# Patient Record
Sex: Male | Born: 1955
Health system: Southern US, Community
[De-identification: ages and names within clinical notes are randomized; demographics above are authoritative.]

---

## 2007-12-15 ENCOUNTER — Ambulatory Visit (HOSPITAL_COMMUNITY): Admission: RE | Admit: 2007-12-15 | Discharge: 2007-12-15 | Payer: Self-pay | Admitting: Gastroenterology

## 2013-09-05 ENCOUNTER — Other Ambulatory Visit: Payer: Self-pay | Admitting: Gastroenterology

## 2013-09-05 DIAGNOSIS — R109 Unspecified abdominal pain: Secondary | ICD-10-CM

## 2013-09-13 ENCOUNTER — Ambulatory Visit
Admission: RE | Admit: 2013-09-13 | Discharge: 2013-09-13 | Disposition: A | Discharge status: Home / Self Care | Payer: 59 | Source: Ambulatory Visit | Attending: Gastroenterology | Admitting: Gastroenterology

## 2013-09-13 DIAGNOSIS — R109 Unspecified abdominal pain: Secondary | ICD-10-CM

## 2014-05-25 ENCOUNTER — Other Ambulatory Visit: Payer: Self-pay | Admitting: Internal Medicine

## 2014-05-25 ENCOUNTER — Ambulatory Visit
Admission: RE | Admit: 2014-05-25 | Discharge: 2014-05-25 | Disposition: A | Discharge status: Home / Self Care | Payer: 59 | Source: Ambulatory Visit | Attending: Internal Medicine | Admitting: Internal Medicine

## 2014-05-25 DIAGNOSIS — M79609 Pain in unspecified limb: Secondary | ICD-10-CM

## 2014-08-27 ENCOUNTER — Other Ambulatory Visit: Payer: Self-pay | Admitting: Internal Medicine

## 2014-08-27 DIAGNOSIS — K824 Cholesterolosis of gallbladder: Secondary | ICD-10-CM

## 2014-09-14 ENCOUNTER — Ambulatory Visit
Admission: RE | Admit: 2014-09-14 | Discharge: 2014-09-14 | Disposition: A | Discharge status: Home / Self Care | Payer: 59 | Source: Ambulatory Visit | Attending: Internal Medicine | Admitting: Internal Medicine

## 2014-09-14 DIAGNOSIS — K824 Cholesterolosis of gallbladder: Secondary | ICD-10-CM

## 2014-09-15 IMAGING — CR DG FOOT COMPLETE 3+V*L*
3 series · 3 of 3 positions shown · non-contrast
Comparison: None.

CLINICAL DATA: Injury.  Left foot pain.

EXAM:
LEFT FOOT - COMPLETE 3+ VIEW

[t foot ap left]
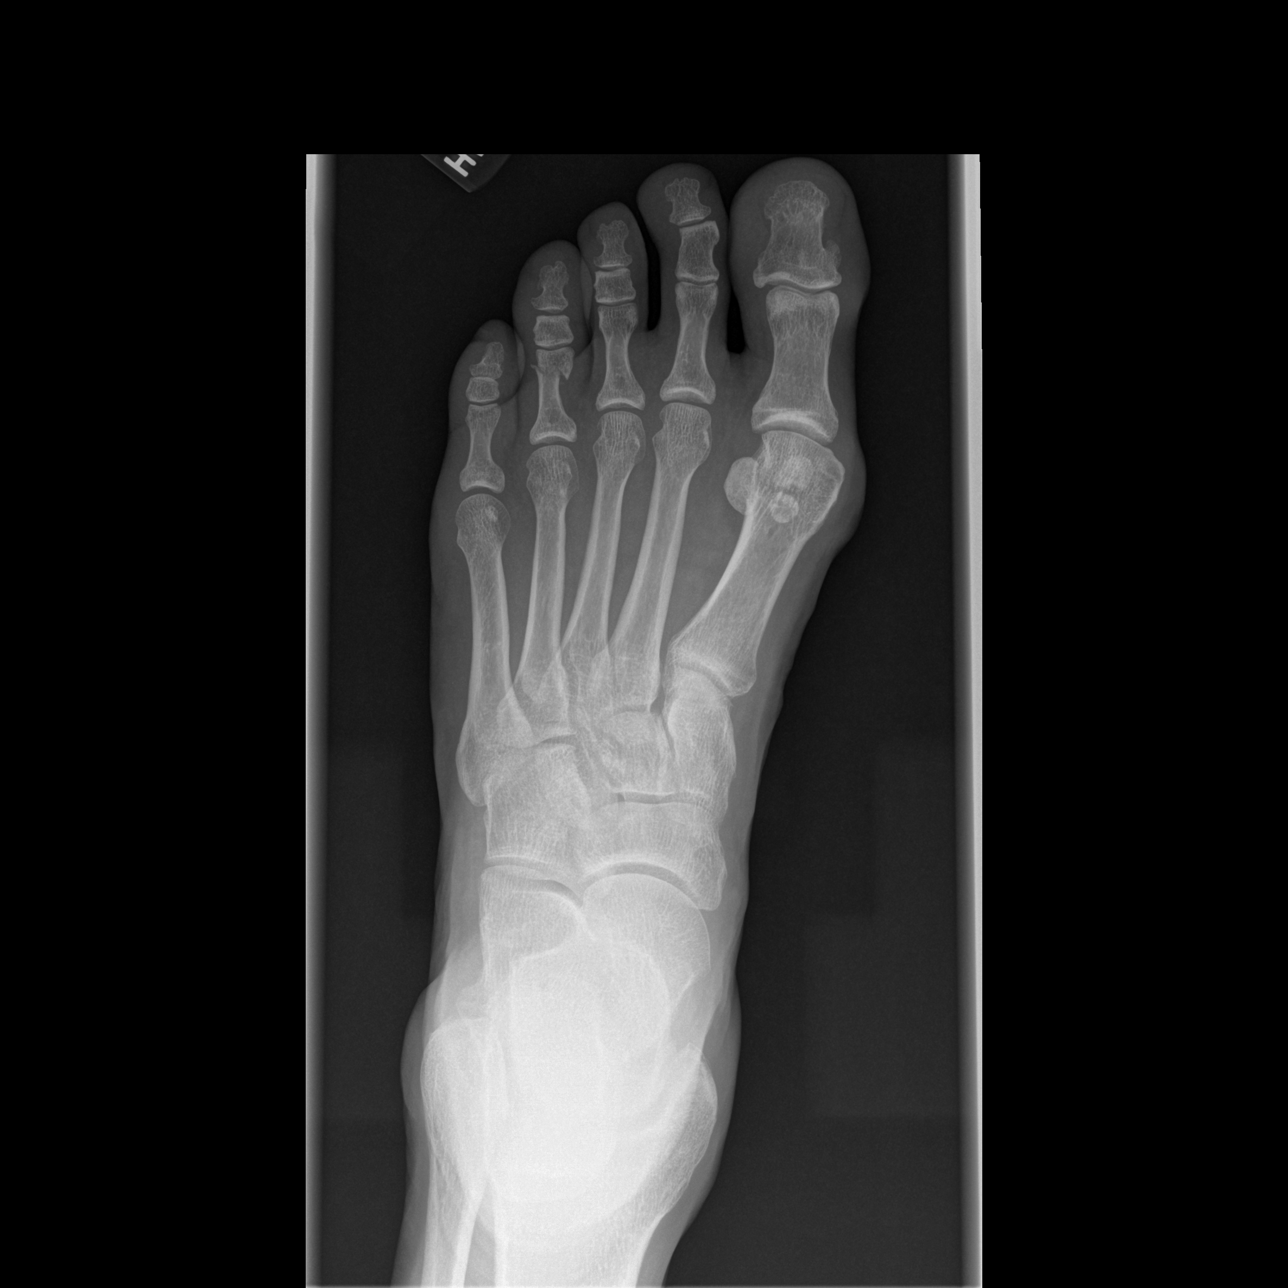

[t foot oblique left]
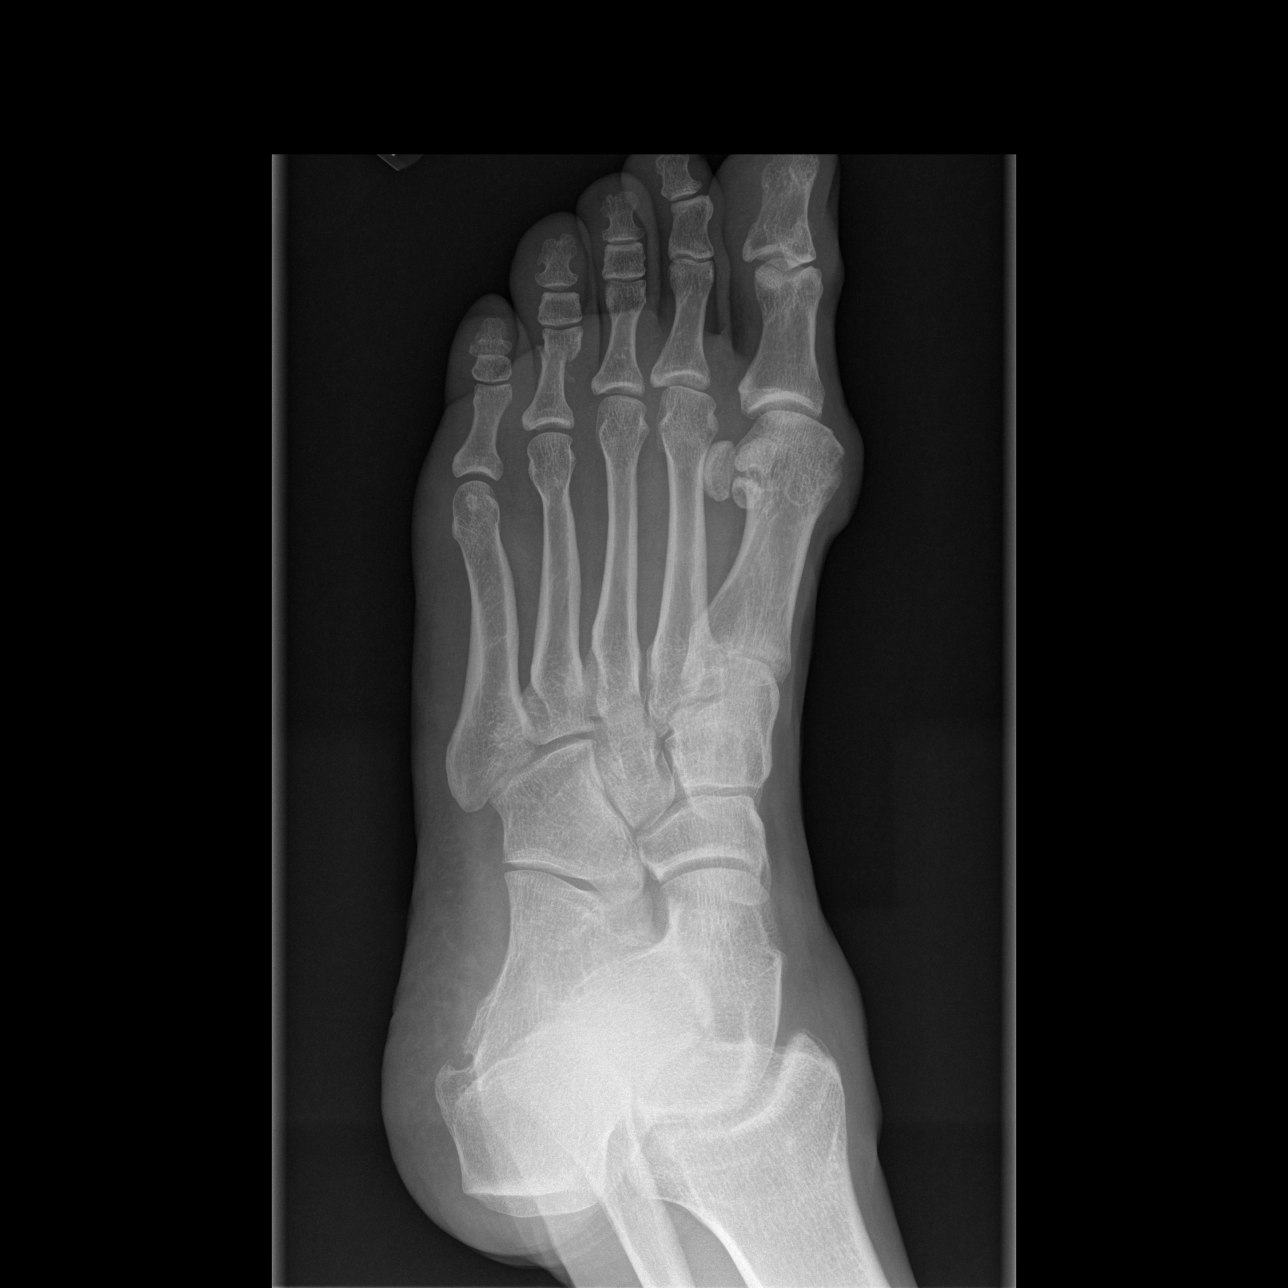

[t foot lat left]
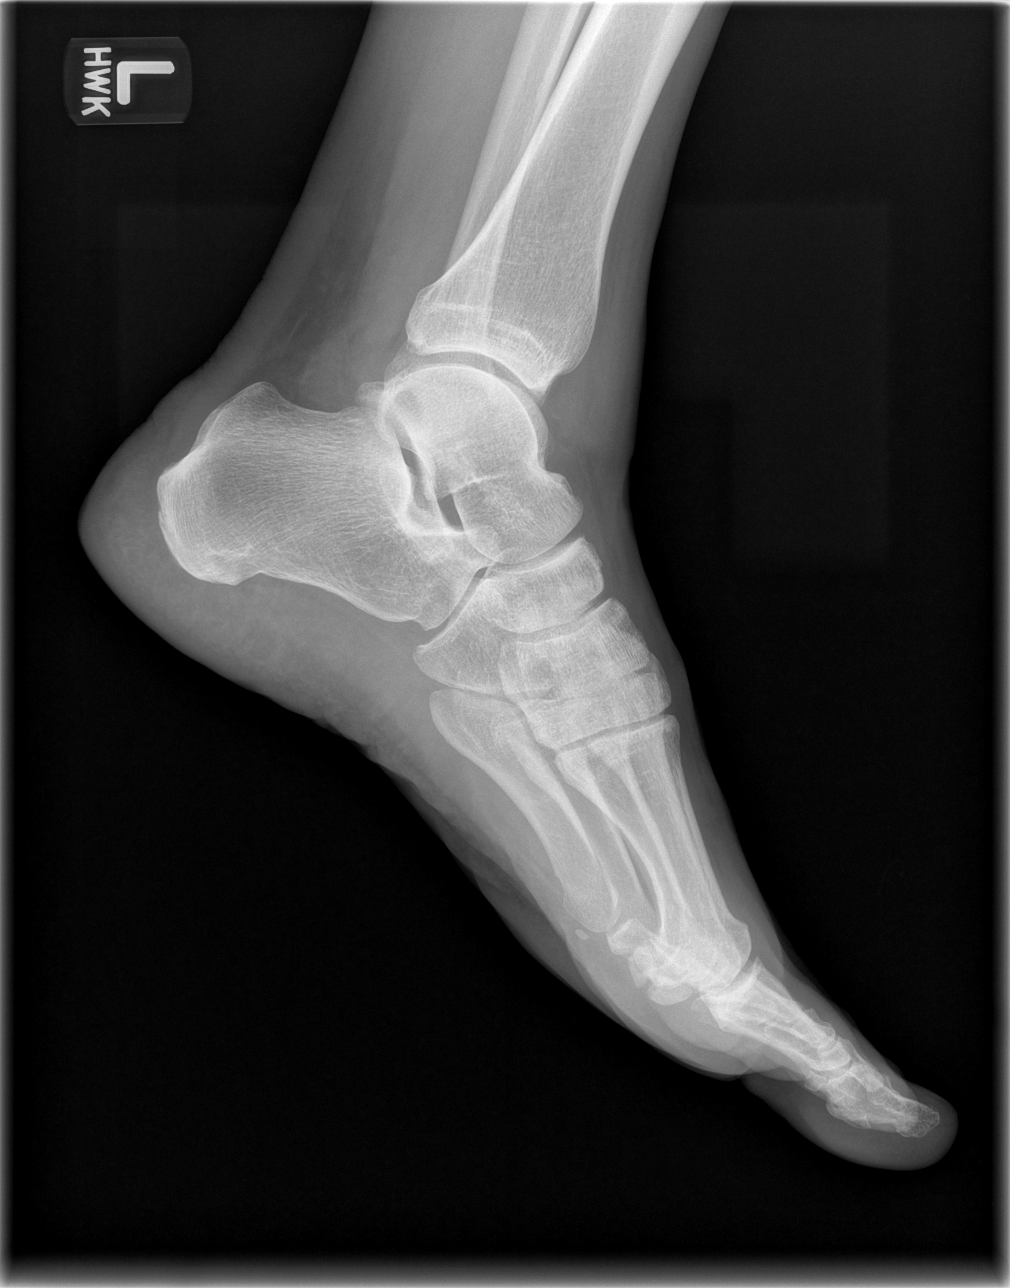

[3 of 3 positions shown; findings below may reference images not displayed]

FINDINGS: There is a transverse fracture across the distal aspect of the
proximal phalanx of the fourth toe. Distal fracture component is
displaced medially by 2 mm. No fracture angulation or combination.

No other fractures.  Joints are normally space and aligned.
IMPRESSION: Transverse fracture of the distal aspect of the proximal phalanx of
the fourth toe with 2 mm of medial displacement.

## 2014-11-07 ENCOUNTER — Other Ambulatory Visit: Payer: Self-pay | Admitting: Surgery

## 2015-09-05 ENCOUNTER — Other Ambulatory Visit: Payer: Self-pay | Admitting: Internal Medicine

## 2015-09-05 DIAGNOSIS — K824 Cholesterolosis of gallbladder: Secondary | ICD-10-CM

## 2015-09-10 ENCOUNTER — Other Ambulatory Visit: Payer: 59

## 2015-09-11 ENCOUNTER — Other Ambulatory Visit: Payer: Self-pay | Admitting: Internal Medicine

## 2015-09-11 ENCOUNTER — Ambulatory Visit
Admission: RE | Admit: 2015-09-11 | Discharge: 2015-09-11 | Disposition: A | Discharge status: Home / Self Care | Payer: 59 | Source: Ambulatory Visit | Attending: Internal Medicine | Admitting: Internal Medicine

## 2015-09-11 DIAGNOSIS — K824 Cholesterolosis of gallbladder: Secondary | ICD-10-CM

## 2015-11-13 ENCOUNTER — Other Ambulatory Visit (HOSPITAL_COMMUNITY): Payer: Self-pay | Admitting: Orthopedic Surgery

## 2015-11-13 DIAGNOSIS — S43432A Superior glenoid labrum lesion of left shoulder, initial encounter: Secondary | ICD-10-CM

## 2015-11-14 ENCOUNTER — Other Ambulatory Visit (HOSPITAL_COMMUNITY): Payer: Self-pay | Admitting: Orthopedic Surgery

## 2015-11-14 DIAGNOSIS — S43432A Superior glenoid labrum lesion of left shoulder, initial encounter: Secondary | ICD-10-CM

## 2016-09-09 DIAGNOSIS — H524 Presbyopia: Secondary | ICD-10-CM | POA: Diagnosis not present

## 2016-09-10 ENCOUNTER — Other Ambulatory Visit: Payer: Self-pay | Admitting: Internal Medicine

## 2016-09-10 DIAGNOSIS — Z0000 Encounter for general adult medical examination without abnormal findings: Secondary | ICD-10-CM | POA: Diagnosis not present

## 2016-09-10 DIAGNOSIS — K824 Cholesterolosis of gallbladder: Secondary | ICD-10-CM | POA: Diagnosis not present

## 2016-09-10 DIAGNOSIS — Z125 Encounter for screening for malignant neoplasm of prostate: Secondary | ICD-10-CM | POA: Diagnosis not present

## 2016-09-22 ENCOUNTER — Ambulatory Visit
Admission: RE | Admit: 2016-09-22 | Discharge: 2016-09-22 | Disposition: A | Discharge status: Home / Self Care | Payer: 59 | Source: Ambulatory Visit | Attending: Internal Medicine | Admitting: Internal Medicine

## 2016-09-22 DIAGNOSIS — K824 Cholesterolosis of gallbladder: Secondary | ICD-10-CM | POA: Diagnosis not present

## 2017-03-06 ENCOUNTER — Other Ambulatory Visit: Payer: Self-pay | Admitting: Surgery

## 2017-05-02 ENCOUNTER — Other Ambulatory Visit: Payer: Self-pay | Admitting: Surgery

## 2017-08-19 DIAGNOSIS — L821 Other seborrheic keratosis: Secondary | ICD-10-CM | POA: Diagnosis not present

## 2017-08-19 DIAGNOSIS — D1801 Hemangioma of skin and subcutaneous tissue: Secondary | ICD-10-CM | POA: Diagnosis not present

## 2017-09-13 ENCOUNTER — Other Ambulatory Visit: Payer: Self-pay | Admitting: Internal Medicine

## 2017-09-13 DIAGNOSIS — Z23 Encounter for immunization: Secondary | ICD-10-CM | POA: Diagnosis not present

## 2017-09-13 DIAGNOSIS — K824 Cholesterolosis of gallbladder: Secondary | ICD-10-CM

## 2017-09-13 DIAGNOSIS — Z0000 Encounter for general adult medical examination without abnormal findings: Secondary | ICD-10-CM | POA: Diagnosis not present

## 2017-09-22 ENCOUNTER — Ambulatory Visit
Admission: RE | Admit: 2017-09-22 | Discharge: 2017-09-22 | Disposition: A | Discharge status: Home / Self Care | Payer: 59 | Source: Ambulatory Visit | Attending: Internal Medicine | Admitting: Internal Medicine

## 2017-09-22 DIAGNOSIS — K824 Cholesterolosis of gallbladder: Secondary | ICD-10-CM

## 2017-10-01 DIAGNOSIS — H524 Presbyopia: Secondary | ICD-10-CM | POA: Diagnosis not present

## 2018-02-17 IMAGING — US US ABDOMEN LIMITED
1 series · 14 of 25 positions shown · non-contrast
Comparison: [DATE] [DATE], [DATE], [DATE] [DATE], [DATE]

CLINICAL DATA: Gallbladder polyp.

EXAM:
ULTRASOUND ABDOMEN LIMITED RIGHT UPPER QUADRANT

[Series 1: us abdomen limited · 0.14mm/px · 14 of 50 slices shown]
[im 1/50]
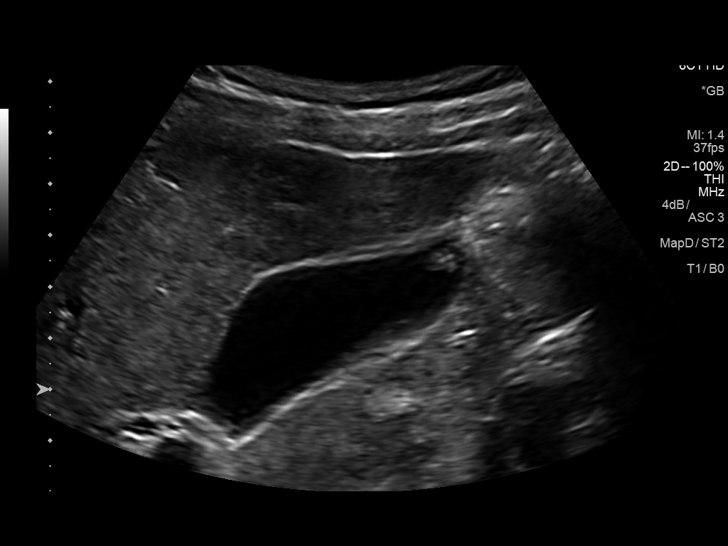
[im 5/50]
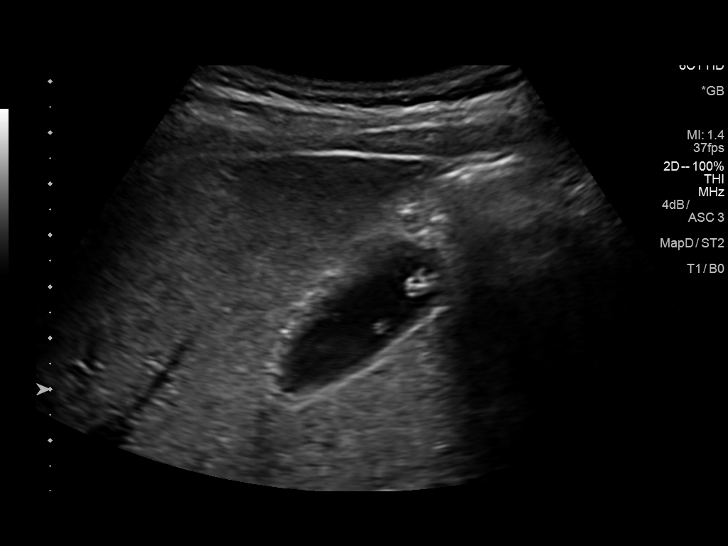
[im 9/50]
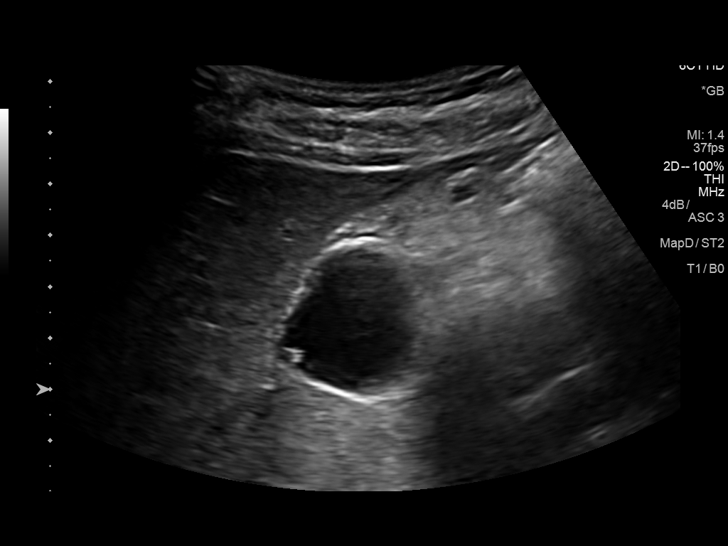
[im 13/50]
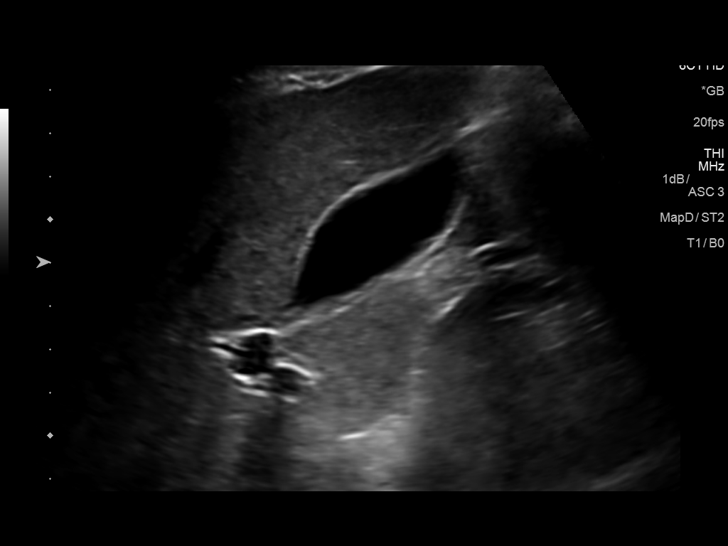
[im 17/50]
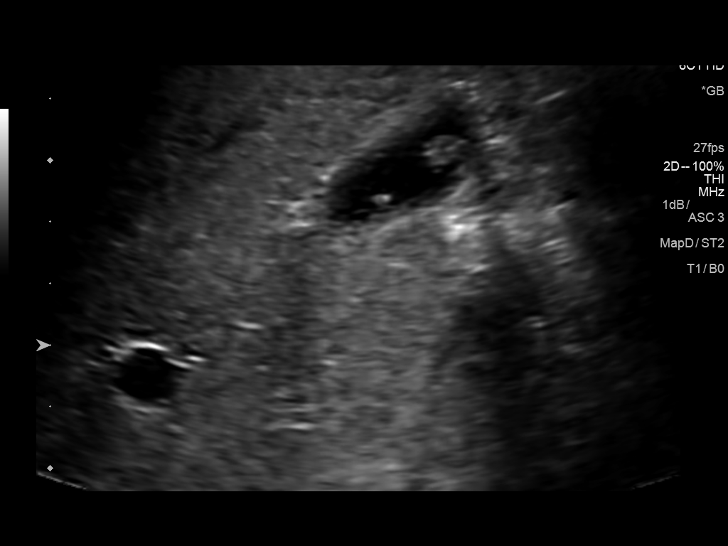
[im 19/50]
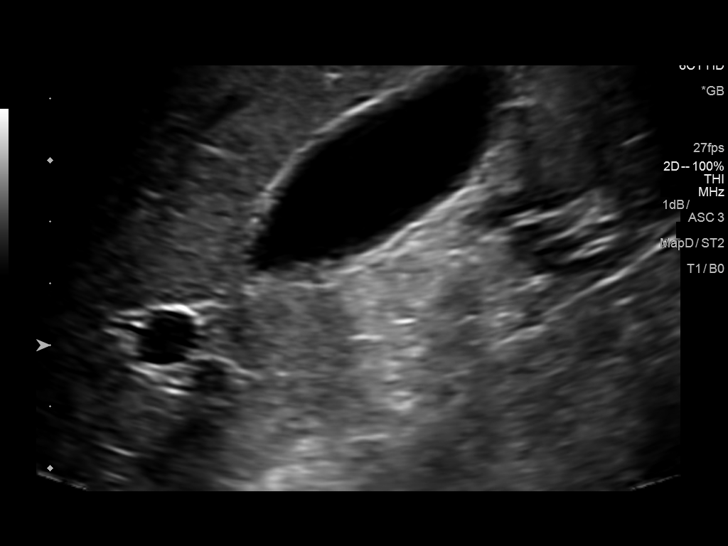
[im 23/50]
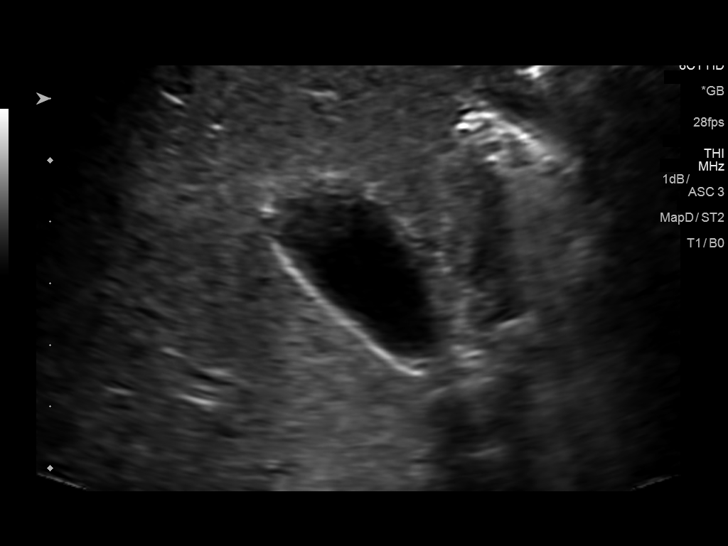
[im 27/50]
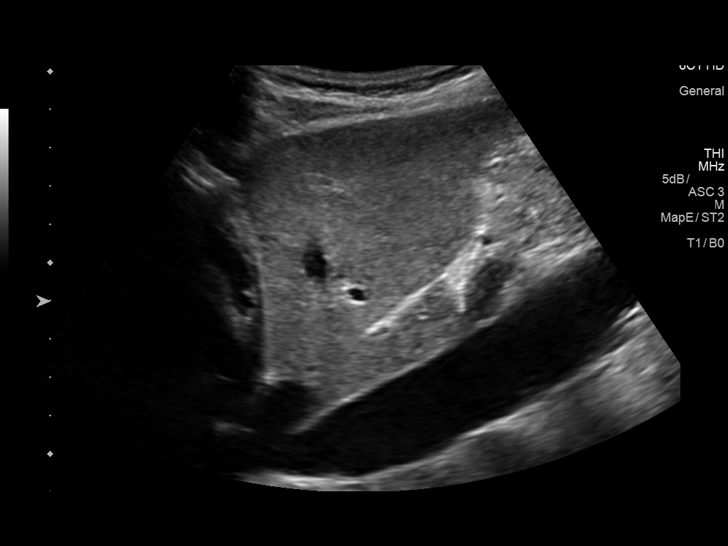
[im 31/50]
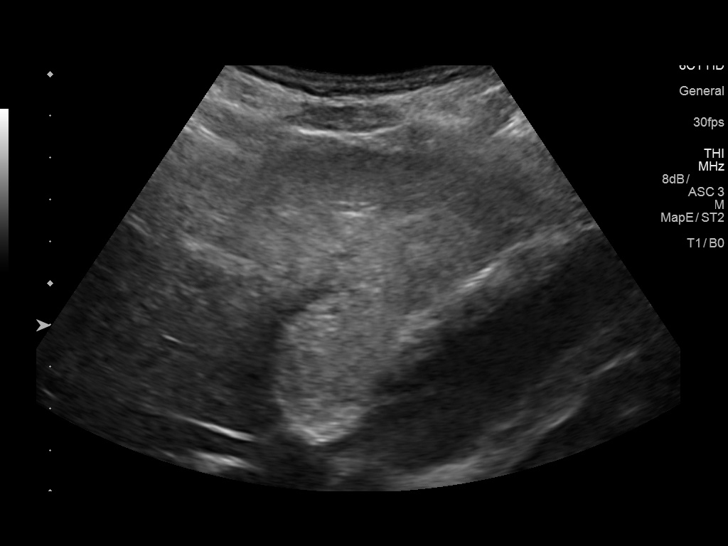
[im 33/50]
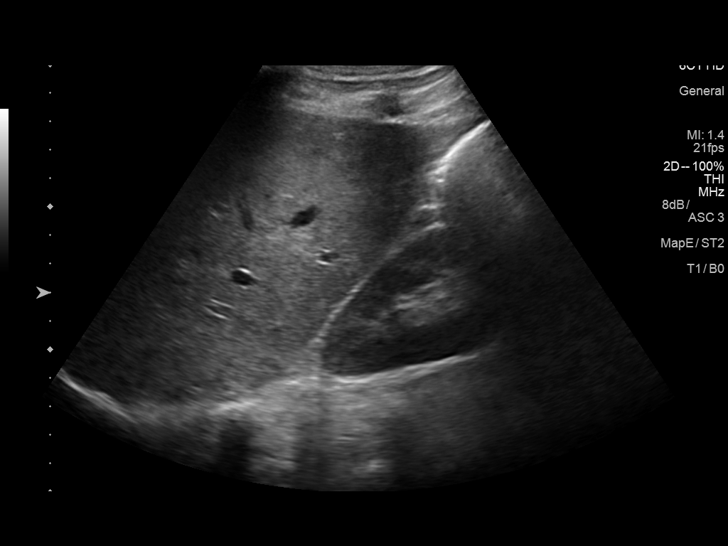
[im 37/50]
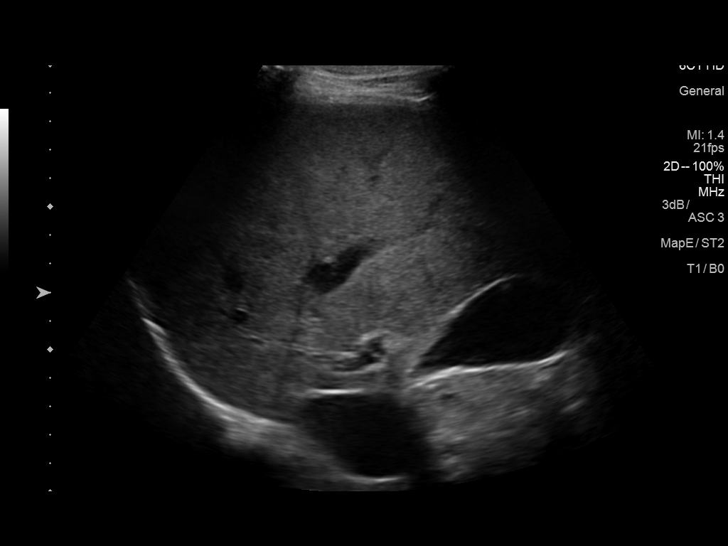
[im 41/50]
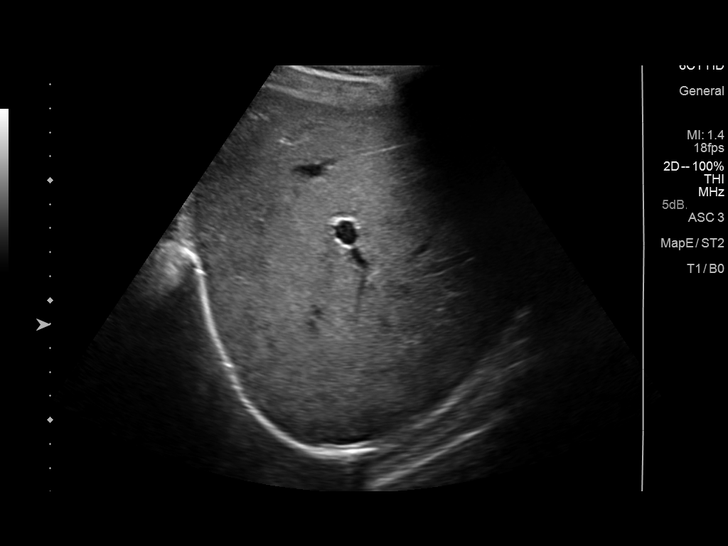
[im 45/50]
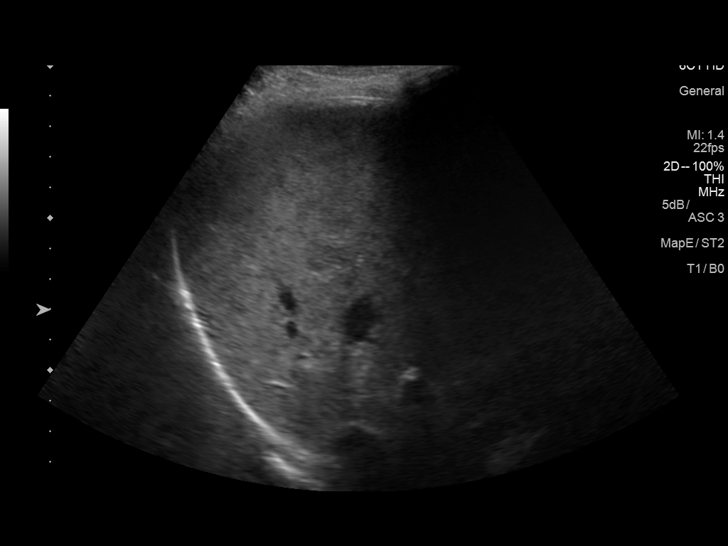
[im 50/50]
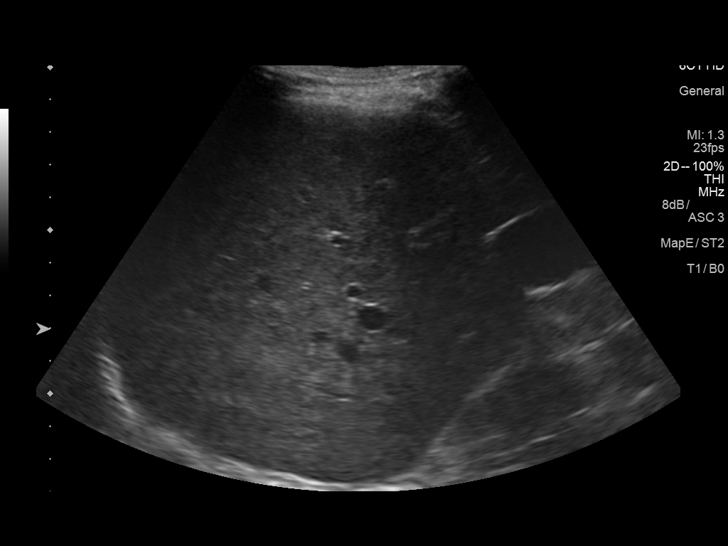

[14 of 25 positions shown; findings below may reference images not displayed]

FINDINGS: Gallbladder:

Fundal gallbladder polyp is identified measuring 6 mm unchanged
compared to prior exam. Polyp in the body of the gallbladder
measures 3 mm, unchanged compared prior exam of 7335. No gallstones
or wall thickening visualized. No sonographic Murphy sign noted by
sonographer.

Common bile duct:

Diameter: 4.2 mm

Liver:

No focal lesion identified. Within normal limits in parenchymal
echogenicity. Portal vein is patent on color Doppler imaging with
normal direction of blood flow towards the liver.
IMPRESSION: Stable gallbladder polyps.  No acute abnormality.

## 2018-09-21 ENCOUNTER — Other Ambulatory Visit: Payer: Self-pay | Admitting: Internal Medicine

## 2018-09-21 DIAGNOSIS — Z125 Encounter for screening for malignant neoplasm of prostate: Secondary | ICD-10-CM | POA: Diagnosis not present

## 2018-09-21 DIAGNOSIS — K824 Cholesterolosis of gallbladder: Secondary | ICD-10-CM

## 2018-09-21 DIAGNOSIS — Z0000 Encounter for general adult medical examination without abnormal findings: Secondary | ICD-10-CM | POA: Diagnosis not present

## 2018-09-28 ENCOUNTER — Ambulatory Visit
Admission: RE | Admit: 2018-09-28 | Discharge: 2018-09-28 | Disposition: A | Discharge status: Home / Self Care | Payer: 59 | Source: Ambulatory Visit | Attending: Internal Medicine | Admitting: Internal Medicine

## 2018-09-28 DIAGNOSIS — K824 Cholesterolosis of gallbladder: Secondary | ICD-10-CM

## 2018-11-18 DIAGNOSIS — H524 Presbyopia: Secondary | ICD-10-CM | POA: Diagnosis not present

## 2019-01-02 DIAGNOSIS — L570 Actinic keratosis: Secondary | ICD-10-CM | POA: Diagnosis not present

## 2019-02-23 DIAGNOSIS — D123 Benign neoplasm of transverse colon: Secondary | ICD-10-CM | POA: Diagnosis not present

## 2019-02-23 DIAGNOSIS — Z1211 Encounter for screening for malignant neoplasm of colon: Secondary | ICD-10-CM | POA: Diagnosis not present

## 2019-02-23 DIAGNOSIS — D125 Benign neoplasm of sigmoid colon: Secondary | ICD-10-CM | POA: Diagnosis not present

## 2019-02-23 DIAGNOSIS — Z8371 Family history of colonic polyps: Secondary | ICD-10-CM | POA: Diagnosis not present

## 2019-02-23 DIAGNOSIS — K635 Polyp of colon: Secondary | ICD-10-CM | POA: Diagnosis not present

## 2019-08-02 DIAGNOSIS — H9313 Tinnitus, bilateral: Secondary | ICD-10-CM | POA: Diagnosis not present

## 2019-08-02 DIAGNOSIS — H903 Sensorineural hearing loss, bilateral: Secondary | ICD-10-CM | POA: Diagnosis not present

## 2019-08-25 DIAGNOSIS — H903 Sensorineural hearing loss, bilateral: Secondary | ICD-10-CM | POA: Diagnosis not present

## 2019-09-25 ENCOUNTER — Other Ambulatory Visit: Payer: Self-pay

## 2019-09-25 ENCOUNTER — Other Ambulatory Visit: Payer: Self-pay | Admitting: Internal Medicine

## 2019-09-25 DIAGNOSIS — K824 Cholesterolosis of gallbladder: Secondary | ICD-10-CM

## 2019-09-25 DIAGNOSIS — Z0000 Encounter for general adult medical examination without abnormal findings: Secondary | ICD-10-CM | POA: Diagnosis not present

## 2019-09-25 DIAGNOSIS — Z125 Encounter for screening for malignant neoplasm of prostate: Secondary | ICD-10-CM | POA: Diagnosis not present

## 2019-10-17 ENCOUNTER — Ambulatory Visit
Admission: RE | Admit: 2019-10-17 | Discharge: 2019-10-17 | Disposition: A | Discharge status: Home / Self Care | Payer: 59 | Source: Ambulatory Visit | Attending: Internal Medicine | Admitting: Internal Medicine

## 2019-10-17 DIAGNOSIS — K824 Cholesterolosis of gallbladder: Secondary | ICD-10-CM

## 2019-12-06 DIAGNOSIS — D485 Neoplasm of uncertain behavior of skin: Secondary | ICD-10-CM | POA: Diagnosis not present

## 2020-01-31 DIAGNOSIS — H903 Sensorineural hearing loss, bilateral: Secondary | ICD-10-CM | POA: Diagnosis not present

## 2020-07-08 ENCOUNTER — Other Ambulatory Visit: Payer: Self-pay | Admitting: *Deleted

## 2020-07-08 ENCOUNTER — Ambulatory Visit: Payer: 59 | Attending: Internal Medicine

## 2020-07-08 ENCOUNTER — Other Ambulatory Visit: Payer: Self-pay

## 2020-07-08 DIAGNOSIS — Z20822 Contact with and (suspected) exposure to covid-19: Secondary | ICD-10-CM

## 2020-07-09 LAB — SARS-COV-2, NAA 2 DAY TAT

## 2020-07-09 LAB — NOVEL CORONAVIRUS, NAA: SARS-CoV-2, NAA: NOT DETECTED

## 2020-08-12 DIAGNOSIS — M9903 Segmental and somatic dysfunction of lumbar region: Secondary | ICD-10-CM | POA: Diagnosis not present

## 2020-08-12 DIAGNOSIS — M9905 Segmental and somatic dysfunction of pelvic region: Secondary | ICD-10-CM | POA: Diagnosis not present

## 2020-08-12 DIAGNOSIS — M9902 Segmental and somatic dysfunction of thoracic region: Secondary | ICD-10-CM | POA: Diagnosis not present

## 2020-08-12 DIAGNOSIS — M62830 Muscle spasm of back: Secondary | ICD-10-CM | POA: Diagnosis not present

## 2020-08-19 DIAGNOSIS — M9903 Segmental and somatic dysfunction of lumbar region: Secondary | ICD-10-CM | POA: Diagnosis not present

## 2020-08-19 DIAGNOSIS — M9902 Segmental and somatic dysfunction of thoracic region: Secondary | ICD-10-CM | POA: Diagnosis not present

## 2020-08-19 DIAGNOSIS — M62830 Muscle spasm of back: Secondary | ICD-10-CM | POA: Diagnosis not present

## 2020-08-19 DIAGNOSIS — M9905 Segmental and somatic dysfunction of pelvic region: Secondary | ICD-10-CM | POA: Diagnosis not present

## 2020-08-21 DIAGNOSIS — M62830 Muscle spasm of back: Secondary | ICD-10-CM | POA: Diagnosis not present

## 2020-08-21 DIAGNOSIS — M9905 Segmental and somatic dysfunction of pelvic region: Secondary | ICD-10-CM | POA: Diagnosis not present

## 2020-08-21 DIAGNOSIS — M9903 Segmental and somatic dysfunction of lumbar region: Secondary | ICD-10-CM | POA: Diagnosis not present

## 2020-08-21 DIAGNOSIS — M9902 Segmental and somatic dysfunction of thoracic region: Secondary | ICD-10-CM | POA: Diagnosis not present

## 2020-08-26 DIAGNOSIS — M9905 Segmental and somatic dysfunction of pelvic region: Secondary | ICD-10-CM | POA: Diagnosis not present

## 2020-08-26 DIAGNOSIS — M62830 Muscle spasm of back: Secondary | ICD-10-CM | POA: Diagnosis not present

## 2020-08-26 DIAGNOSIS — M9902 Segmental and somatic dysfunction of thoracic region: Secondary | ICD-10-CM | POA: Diagnosis not present

## 2020-08-26 DIAGNOSIS — M9903 Segmental and somatic dysfunction of lumbar region: Secondary | ICD-10-CM | POA: Diagnosis not present

## 2020-08-28 DIAGNOSIS — M62830 Muscle spasm of back: Secondary | ICD-10-CM | POA: Diagnosis not present

## 2020-08-28 DIAGNOSIS — M9903 Segmental and somatic dysfunction of lumbar region: Secondary | ICD-10-CM | POA: Diagnosis not present

## 2020-08-28 DIAGNOSIS — M9905 Segmental and somatic dysfunction of pelvic region: Secondary | ICD-10-CM | POA: Diagnosis not present

## 2020-08-28 DIAGNOSIS — M9902 Segmental and somatic dysfunction of thoracic region: Secondary | ICD-10-CM | POA: Diagnosis not present

## 2020-09-05 DIAGNOSIS — M62830 Muscle spasm of back: Secondary | ICD-10-CM | POA: Diagnosis not present

## 2020-09-05 DIAGNOSIS — M9902 Segmental and somatic dysfunction of thoracic region: Secondary | ICD-10-CM | POA: Diagnosis not present

## 2020-09-05 DIAGNOSIS — C44622 Squamous cell carcinoma of skin of right upper limb, including shoulder: Secondary | ICD-10-CM | POA: Diagnosis not present

## 2020-09-05 DIAGNOSIS — L564 Polymorphous light eruption: Secondary | ICD-10-CM | POA: Diagnosis not present

## 2020-09-05 DIAGNOSIS — D485 Neoplasm of uncertain behavior of skin: Secondary | ICD-10-CM | POA: Diagnosis not present

## 2020-09-05 DIAGNOSIS — M9903 Segmental and somatic dysfunction of lumbar region: Secondary | ICD-10-CM | POA: Diagnosis not present

## 2020-09-05 DIAGNOSIS — L570 Actinic keratosis: Secondary | ICD-10-CM | POA: Diagnosis not present

## 2020-09-05 DIAGNOSIS — M9905 Segmental and somatic dysfunction of pelvic region: Secondary | ICD-10-CM | POA: Diagnosis not present

## 2020-09-12 DIAGNOSIS — M9902 Segmental and somatic dysfunction of thoracic region: Secondary | ICD-10-CM | POA: Diagnosis not present

## 2020-09-12 DIAGNOSIS — M62830 Muscle spasm of back: Secondary | ICD-10-CM | POA: Diagnosis not present

## 2020-09-12 DIAGNOSIS — M9905 Segmental and somatic dysfunction of pelvic region: Secondary | ICD-10-CM | POA: Diagnosis not present

## 2020-09-12 DIAGNOSIS — M9903 Segmental and somatic dysfunction of lumbar region: Secondary | ICD-10-CM | POA: Diagnosis not present

## 2020-09-19 DIAGNOSIS — M9903 Segmental and somatic dysfunction of lumbar region: Secondary | ICD-10-CM | POA: Diagnosis not present

## 2020-09-19 DIAGNOSIS — M9905 Segmental and somatic dysfunction of pelvic region: Secondary | ICD-10-CM | POA: Diagnosis not present

## 2020-09-19 DIAGNOSIS — M9902 Segmental and somatic dysfunction of thoracic region: Secondary | ICD-10-CM | POA: Diagnosis not present

## 2020-09-19 DIAGNOSIS — M62830 Muscle spasm of back: Secondary | ICD-10-CM | POA: Diagnosis not present

## 2020-09-25 ENCOUNTER — Other Ambulatory Visit: Payer: Self-pay | Admitting: Internal Medicine

## 2020-09-25 DIAGNOSIS — Z0000 Encounter for general adult medical examination without abnormal findings: Secondary | ICD-10-CM | POA: Diagnosis not present

## 2020-09-25 DIAGNOSIS — Z125 Encounter for screening for malignant neoplasm of prostate: Secondary | ICD-10-CM | POA: Diagnosis not present

## 2020-09-25 DIAGNOSIS — K824 Cholesterolosis of gallbladder: Secondary | ICD-10-CM | POA: Diagnosis not present

## 2020-09-26 ENCOUNTER — Other Ambulatory Visit: Payer: Self-pay | Admitting: Internal Medicine

## 2020-09-26 DIAGNOSIS — K824 Cholesterolosis of gallbladder: Secondary | ICD-10-CM

## 2020-10-04 ENCOUNTER — Ambulatory Visit
Admission: RE | Admit: 2020-10-04 | Discharge: 2020-10-04 | Disposition: A | Discharge status: Home / Self Care | Payer: 59 | Source: Ambulatory Visit | Attending: Internal Medicine | Admitting: Internal Medicine

## 2020-10-04 DIAGNOSIS — K824 Cholesterolosis of gallbladder: Secondary | ICD-10-CM | POA: Diagnosis not present

## 2020-12-12 DIAGNOSIS — H524 Presbyopia: Secondary | ICD-10-CM | POA: Diagnosis not present

## 2021-01-25 IMAGING — US US ABDOMEN LIMITED
1 series · 14 of 25 positions shown · non-contrast
Comparison: October 17, 2019.

CLINICAL DATA: Gallbladder polyps

EXAM:
ULTRASOUND ABDOMEN LIMITED RIGHT UPPER QUADRANT

[Series 1: us abdomen limited · 0.17mm/px · 14 of 44 slices shown]
[im 1/44]
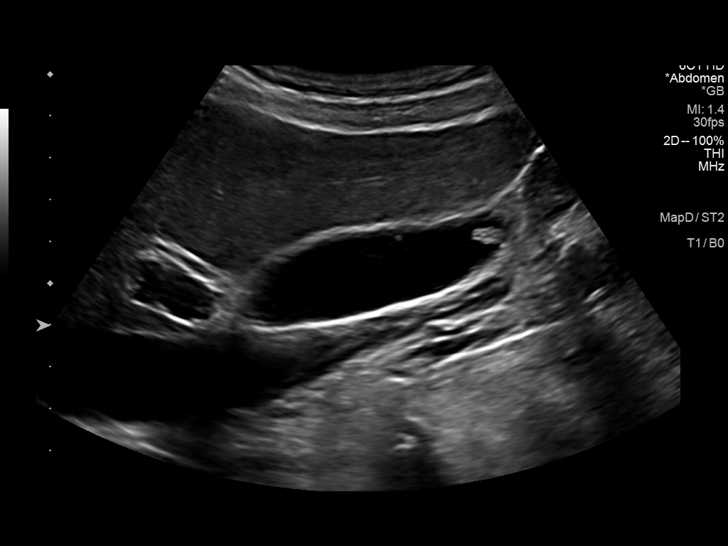
[im 4/44]
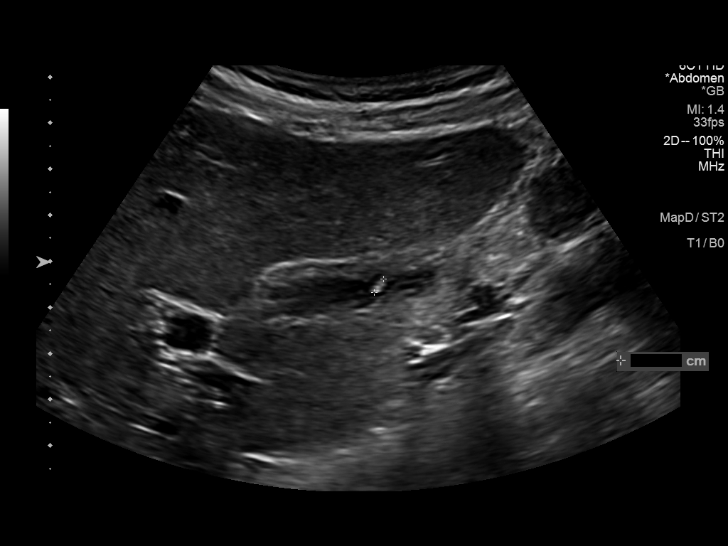
[im 8/44]
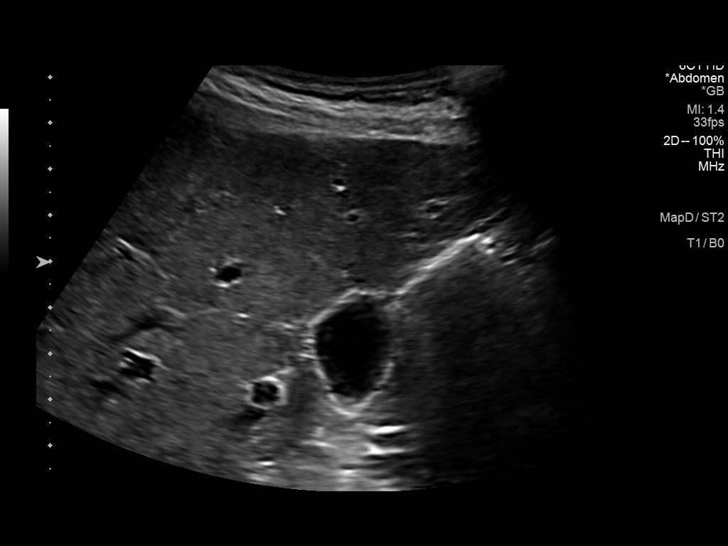
[im 11/44]
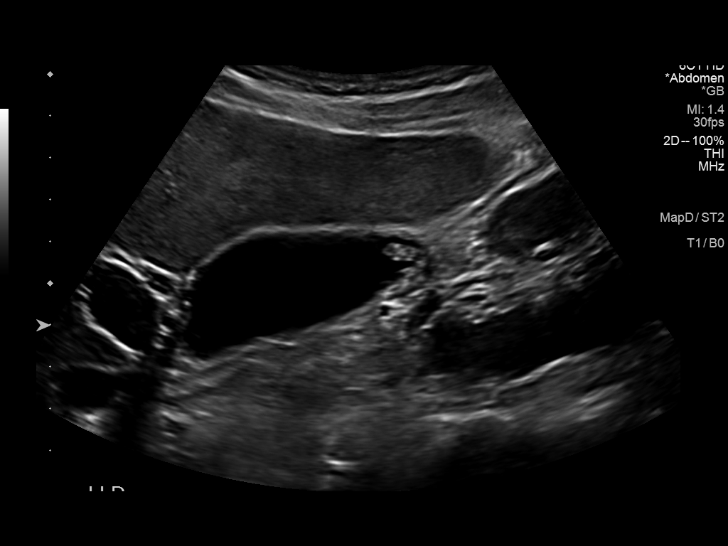
[im 15/44]
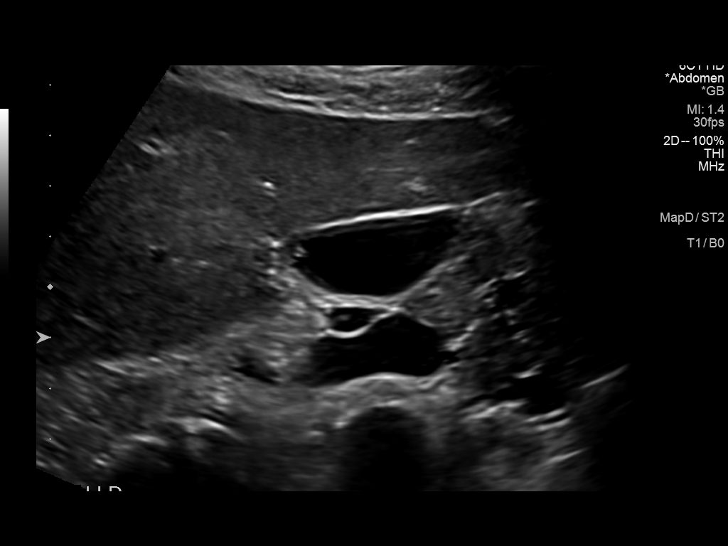
[im 17/44]
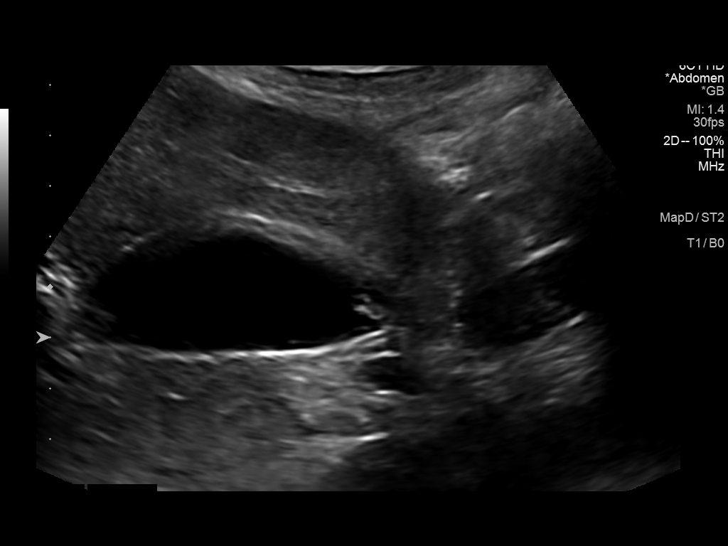
[im 20/44]
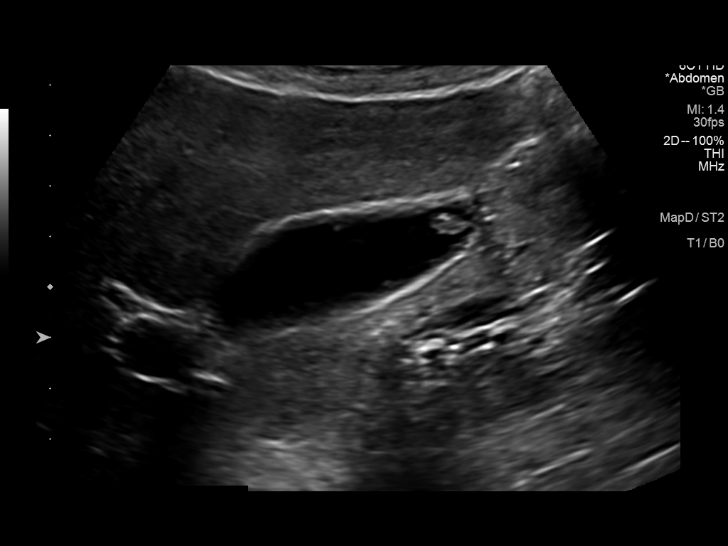
[im 24/44]
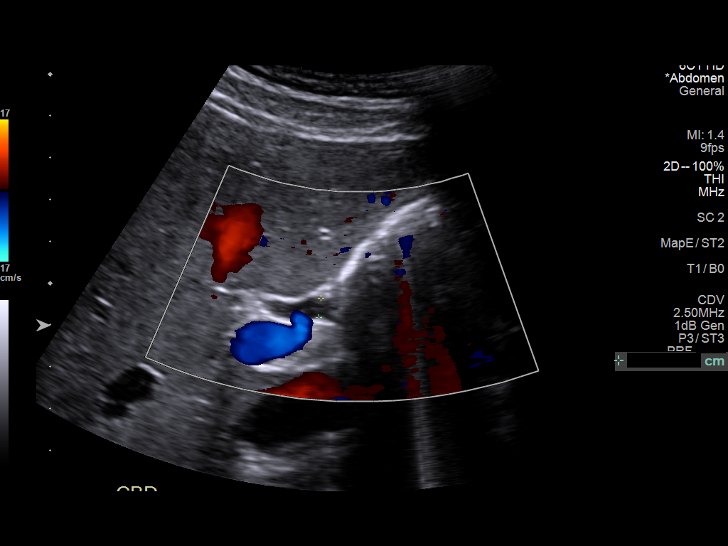
[im 27/44]
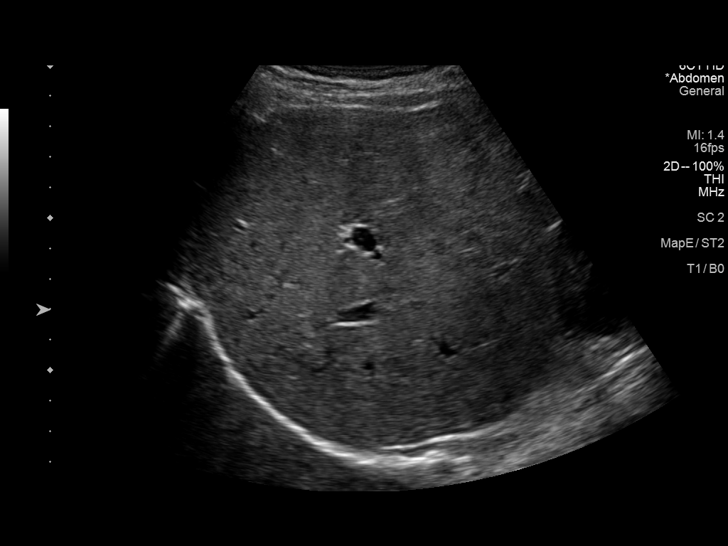
[im 29/44]
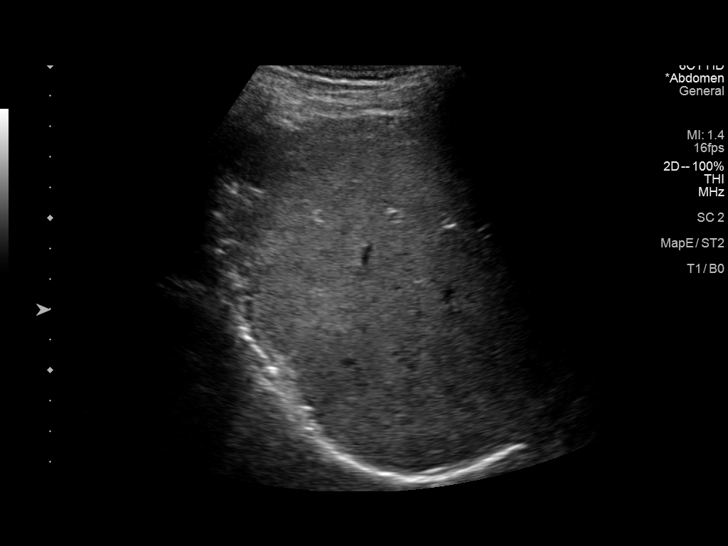
[im 33/44]
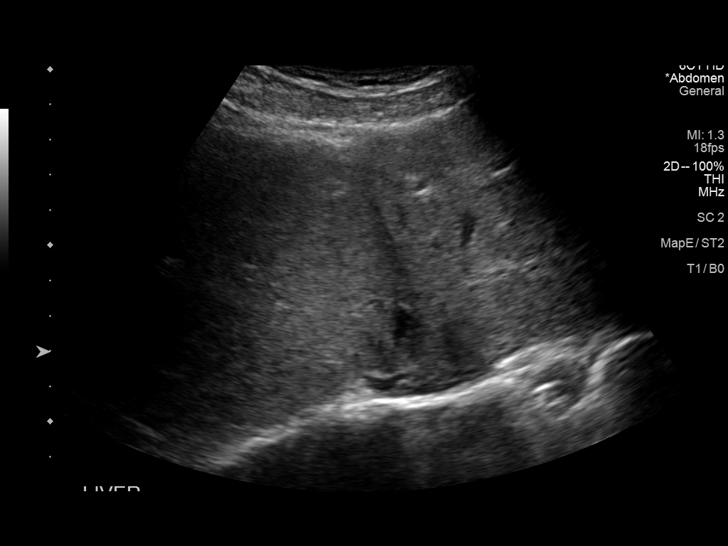
[im 36/44]
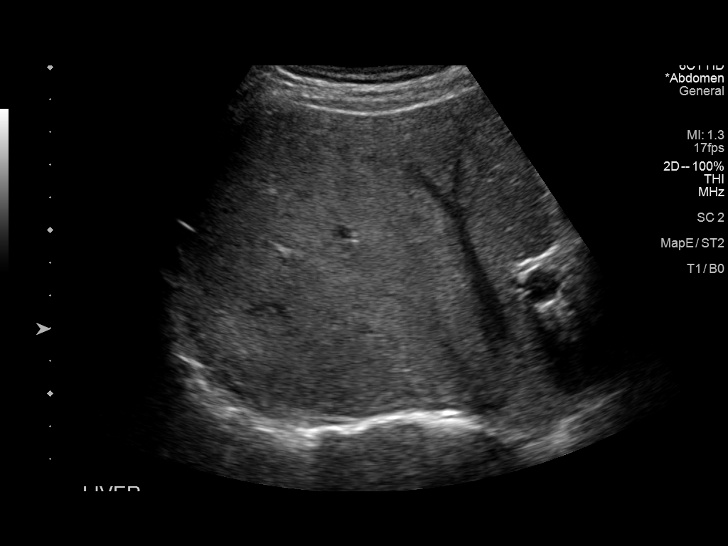
[im 40/44]
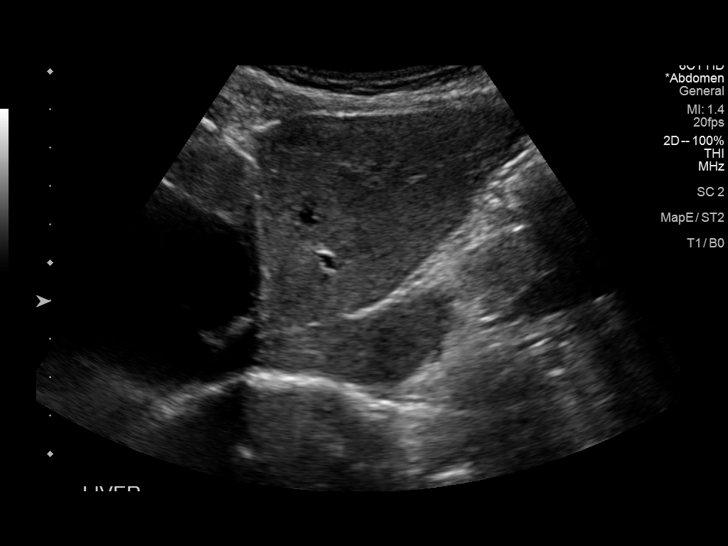
[im 44/44]
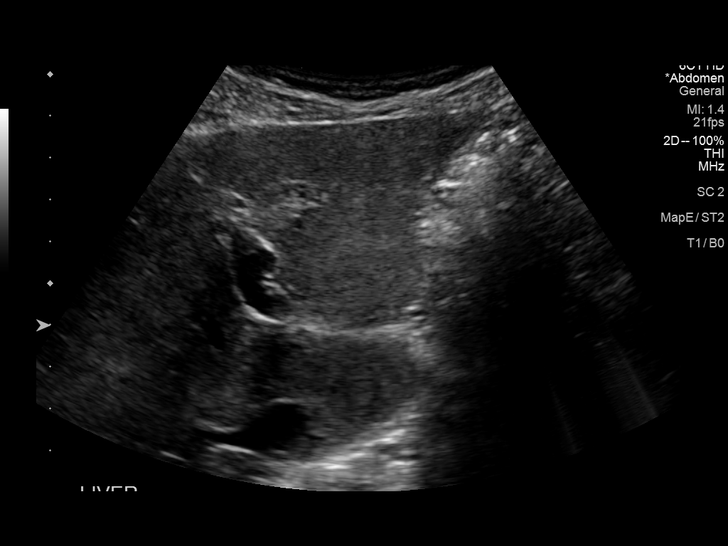

[14 of 25 positions shown; findings below may reference images not displayed]

FINDINGS: Gallbladder:

There is a polyp in the region of the fundus of the gallbladder
measuring 8 mm in length, essentially stable compared to previous
study. A 4 mm polyp is noted as well, also present previously. There
are occasional scattered tiny polyps as well. There are no echogenic
foci which move and shadow as is expected with gallstones. No
gallbladder wall thickening or pericholecystic fluid. No sonographic
Murphy sign noted by sonographer.

Common bile duct:

Diameter: 5 mm. No intrahepatic or extrahepatic biliary duct
dilatation.

Liver:

No focal lesion identified. Within normal limits in parenchymal
echogenicity. Portal vein is patent on color Doppler imaging with
normal direction of blood flow towards the liver.

Other: None.
IMPRESSION: Essentially stable gallbladder polyps, largest measuring 8 mm. Per
consensus guidelines, polyp of 8 mm size should be followed annually
by ultrasound.

No gallstones, gallbladder wall thickening, or pericholecystic
fluid.

Study otherwise unremarkable.

## 2021-02-04 ENCOUNTER — Other Ambulatory Visit (HOSPITAL_COMMUNITY): Payer: Self-pay | Admitting: Internal Medicine

## 2021-03-26 DIAGNOSIS — L570 Actinic keratosis: Secondary | ICD-10-CM | POA: Diagnosis not present

## 2021-03-26 DIAGNOSIS — L814 Other melanin hyperpigmentation: Secondary | ICD-10-CM | POA: Diagnosis not present

## 2021-03-26 DIAGNOSIS — D1801 Hemangioma of skin and subcutaneous tissue: Secondary | ICD-10-CM | POA: Diagnosis not present

## 2021-03-26 DIAGNOSIS — L821 Other seborrheic keratosis: Secondary | ICD-10-CM | POA: Diagnosis not present

## 2021-03-26 DIAGNOSIS — D225 Melanocytic nevi of trunk: Secondary | ICD-10-CM | POA: Diagnosis not present

## 2021-03-26 DIAGNOSIS — L820 Inflamed seborrheic keratosis: Secondary | ICD-10-CM | POA: Diagnosis not present

## 2021-03-26 DIAGNOSIS — Z85828 Personal history of other malignant neoplasm of skin: Secondary | ICD-10-CM | POA: Diagnosis not present

## 2021-04-21 ENCOUNTER — Other Ambulatory Visit (HOSPITAL_COMMUNITY): Payer: Self-pay

## 2021-04-21 MED FILL — Fluticasone Propionate Nasal Susp 50 MCG/ACT: NASAL | 30 days supply | Qty: 16 | Fill #0 | Status: AC

## 2021-07-24 ENCOUNTER — Other Ambulatory Visit (HOSPITAL_COMMUNITY): Payer: Self-pay

## 2021-07-24 MED FILL — Fluticasone Propionate Nasal Susp 50 MCG/ACT: NASAL | 30 days supply | Qty: 16 | Fill #1 | Status: AC

## 2021-09-11 ENCOUNTER — Other Ambulatory Visit: Payer: Self-pay | Admitting: Internal Medicine

## 2021-09-11 DIAGNOSIS — K824 Cholesterolosis of gallbladder: Secondary | ICD-10-CM

## 2021-09-12 DIAGNOSIS — Z23 Encounter for immunization: Secondary | ICD-10-CM | POA: Diagnosis not present

## 2021-09-17 ENCOUNTER — Ambulatory Visit
Admission: RE | Admit: 2021-09-17 | Discharge: 2021-09-17 | Disposition: A | Discharge status: Home / Self Care | Payer: 59 | Source: Ambulatory Visit | Attending: Internal Medicine | Admitting: Internal Medicine

## 2021-09-17 DIAGNOSIS — K824 Cholesterolosis of gallbladder: Secondary | ICD-10-CM | POA: Diagnosis not present

## 2021-10-01 ENCOUNTER — Other Ambulatory Visit (HOSPITAL_COMMUNITY): Payer: Self-pay

## 2021-10-01 DIAGNOSIS — H00014 Hordeolum externum left upper eyelid: Secondary | ICD-10-CM | POA: Diagnosis not present

## 2021-10-01 MED ORDER — TOBRAMYCIN-DEXAMETHASONE 0.3-0.1 % OP SUSP
OPHTHALMIC | 0 refills | Status: DC
  Filled 2021-10-01: qty 5, 14d supply, fill #0

## 2021-10-01 MED ORDER — AZITHROMYCIN 250 MG PO TABS
ORAL_TABLET | ORAL | 0 refills | Status: AC
  Filled 2021-10-01: qty 6, 5d supply, fill #0

## 2021-10-21 ENCOUNTER — Other Ambulatory Visit (HOSPITAL_COMMUNITY): Payer: Self-pay

## 2021-10-21 MED FILL — Fluticasone Propionate Nasal Susp 50 MCG/ACT: NASAL | 30 days supply | Qty: 16 | Fill #2 | Status: AC

## 2021-10-27 ENCOUNTER — Other Ambulatory Visit (HOSPITAL_COMMUNITY): Payer: Self-pay

## 2021-10-30 DIAGNOSIS — H01004 Unspecified blepharitis left upper eyelid: Secondary | ICD-10-CM | POA: Diagnosis not present

## 2021-11-25 ENCOUNTER — Emergency Department (HOSPITAL_COMMUNITY)
Admission: EM | Admit: 2021-11-25 | Discharge: 2021-11-25 | Disposition: A | Discharge status: Home / Self Care | Payer: 59 | Attending: Emergency Medicine | Admitting: Emergency Medicine

## 2021-11-25 ENCOUNTER — Other Ambulatory Visit: Payer: Self-pay

## 2021-11-25 ENCOUNTER — Encounter (HOSPITAL_COMMUNITY): Payer: Self-pay | Admitting: Emergency Medicine

## 2021-11-25 DIAGNOSIS — R110 Nausea: Secondary | ICD-10-CM | POA: Insufficient documentation

## 2021-11-25 DIAGNOSIS — R55 Syncope and collapse: Secondary | ICD-10-CM | POA: Diagnosis not present

## 2021-11-25 LAB — COMPREHENSIVE METABOLIC PANEL
ALT: 24 U/L (ref 0–44)
AST: 29 U/L (ref 15–41)
Albumin: 4.4 g/dL (ref 3.5–5.0)
Alkaline Phosphatase: 36 U/L — ABNORMAL LOW (ref 38–126)
Anion gap: 8 (ref 5–15)
BUN: 19 mg/dL (ref 8–23)
CO2: 28 mmol/L (ref 22–32)
Calcium: 9.9 mg/dL (ref 8.9–10.3)
Chloride: 103 mmol/L (ref 98–111)
Creatinine, Ser: 1.03 mg/dL (ref 0.61–1.24)
GFR, Estimated: >60 mL/min (ref >60)
Glucose, Bld: 82 mg/dL (ref 70–99)
Potassium: 4.6 mmol/L (ref 3.5–5.1)
Sodium: 139 mmol/L (ref 135–145)
Total Bilirubin: 0.7 mg/dL (ref 0.3–1.2)
Total Protein: 7 g/dL (ref 6.5–8.1)

## 2021-11-25 LAB — I-STAT CHEM 8, ED
BUN: 25 mg/dL — ABNORMAL HIGH (ref 8–23)
Calcium, Ion: 1.23 mmol/L (ref 1.15–1.40)
Chloride: 104 mmol/L (ref 98–111)
Creatinine, Ser: 0.9 mg/dL (ref 0.61–1.24)
Glucose, Bld: 78 mg/dL (ref 70–99)
HCT: 45 % (ref 39.0–52.0)
Hemoglobin: 15.3 g/dL (ref 13.0–17.0)
Potassium: 4.6 mmol/L (ref 3.5–5.1)
Sodium: 141 mmol/L (ref 135–145)
TCO2: 29 mmol/L (ref 22–32)

## 2021-11-25 LAB — CBC
HCT: 47.1 % (ref 39.0–52.0)
Hemoglobin: 15.8 g/dL (ref 13.0–17.0)
MCH: 32.7 pg (ref 26.0–34.0)
MCHC: 33.5 g/dL (ref 30.0–36.0)
MCV: 97.5 fL (ref 80.0–100.0)
Platelets: 186 10*3/uL (ref 150–400)
RBC: 4.83 MIL/uL (ref 4.22–5.81)
RDW: 12.6 % (ref 11.5–15.5)
WBC: 9.6 10*3/uL (ref 4.0–10.5)
nRBC: 0 % (ref 0.0–0.2)

## 2021-11-25 LAB — TROPONIN I (HIGH SENSITIVITY): Troponin I (High Sensitivity): 4 ng/L (ref <18)

## 2021-11-25 MED ORDER — LACTATED RINGERS IV BOLUS
1000.0000 mL | Freq: Once | INTRAVENOUS | Status: AC
  Administered 2021-11-25: 1000 mL via INTRAVENOUS

## 2021-11-25 NOTE — Discharge Instructions (Signed)
Your work-up here appears to be normal.  I suspect that you had a vasovagal episode.  Please drink plenty of fluids and recheck with your doctor. Return to the emergency department if you are having new or worsening symptoms at any time especially return of lightheadedness, near-syncope, chest pain, or other new symptoms.

## 2021-11-25 NOTE — ED Triage Notes (Signed)
Pt here POV from the OR. Pt scrubbed in for surgery and began to feel nauseous, and flushed ,lightheaded and dizzy. Pt vomited and felt better. No LOC reported.

## 2021-11-25 NOTE — ED Provider Notes (Signed)
[facility] EMERGENCY DEPARTMENT Provider Note   CSN: [id number] Arrival date & time: 11/25/21  1301     History Chief Complaint  Patient presents with   Near Syncope    Dan Chavez is a 65 y.o. male.  HPI 65 year old male presents today with near syncopal episode.  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  He reports that he ate his normal lunch today.  He had packet brought from home.  While he was in surgery, he began feeling nauseated.  His nausea worsened.  He began feeling flushed and somewhat lightheaded.  He sat down and lowered himself to the floor.  He did not have loss of consciousness.  He denies any other symptoms at that time.  He then left the operating room and vomited several times.  He reports that the emesis was chiefly the contents of his stomach from lunch.  He did not note any blood or dark substance.  He has no history of hematemesis or GI bleeding.  He now feels improved. Specifically denies any chest pain, history of cardiac problems, history of vasovagal syncope, fever, chills, abdominal pain, or diarrhea.     History reviewed. No pertinent past medical history. Who reports no significant past medical history. There are no problems to display for this patient.   History reviewed. No pertinent surgical history.     History reviewed. No pertinent family history.     Home Medications Prior to Admission medications   Medication Sig Start Date End Date Taking? Authorizing Provider  azithromycin (ZITHROMAX) 250 MG tablet Take 2 tablets by mouth on day 1, then take 1 tablet daily on days 2-5. 10/01/21     fluticasone (FLONASE) 50 MCG/ACT nasal spray instill 1 to 2 sprays into each nostril once daily 11/07/14   Gaye Pollack, MD  fluticasone [facility]) 50 MCG/ACT nasal spray INSTILL 2 SPRAYS INTO EACH NOSTRIL ONCE DAILY 02/04/21 02/04/22  Lavone Orn, MD  sildenafil (REVATIO) 20 MG tablet TAKE 1 TABLET EVERY  MORNING FOR RAYNAUDS SYMPTOMS 05/03/17   Prescott Gum, Collier Salina, MD  tobramycin-dexamethasone [facility]) ophthalmic solution Instill 1 drop into left eye 4 times daily for 7 days, then 1 drop twice daily for 1 week. 10/01/21       Allergies    Patient has no allergy information on record.  Review of Systems   Review of Systems  Constitutional:  Negative for fever and unexpected weight change.  HENT: Negative.    Eyes: Negative.   Respiratory: Negative.    Cardiovascular: Negative.   Gastrointestinal:  Positive for nausea and vomiting.  Genitourinary: Negative.   Musculoskeletal: Negative.   Skin: Negative.   Allergic/Immunologic: Negative.   Neurological:  Positive for light-headedness.  Hematological: Negative.   Psychiatric/Behavioral: Negative.    All other systems reviewed and are negative.  Physical Exam Updated Vital Signs BP 126/64   Pulse 78   Temp 97.6 F (36.4 C) (Oral)   Resp 12   SpO2 100%   Physical Exam Vitals and nursing note reviewed.  Constitutional:      Appearance: Normal appearance.  HENT:     Head: Normocephalic.     Right Ear: External ear normal.     Left Ear: External ear normal.     Nose: Nose normal.     Mouth/Throat:     Pharynx: Oropharynx is clear.  Eyes:     Pupils: Pupils are equal, round, and reactive to light.  Cardiovascular:  Rate and Rhythm: Normal rate and regular rhythm.  Pulmonary:     Effort: Pulmonary effort is normal.     Breath sounds: Normal breath sounds.  Abdominal:     General: Abdomen is flat. Bowel sounds are normal.     Palpations: Abdomen is soft.     Tenderness: There is no abdominal tenderness.  Musculoskeletal:        General: No swelling. Normal range of motion.     Cervical back: Normal range of motion.  Skin:    General: Skin is warm and dry.     Capillary Refill: Capillary refill takes less than 2 seconds.  Neurological:     General: No focal deficit present.     Mental Status: He is alert. Mental  status is at baseline.  Psychiatric:        Mood and Affect: Mood normal.        Behavior: Behavior normal.    ED Results / Procedures / Treatments   Labs (all labs ordered are listed, but only abnormal results are displayed) Labs Reviewed  COMPREHENSIVE METABOLIC PANEL - Abnormal; Notable for the following components:      Result Value   Alkaline Phosphatase 36 (*)    All other components within normal limits  I-STAT CHEM 8, ED - Abnormal; Notable for the following components:   BUN 25 (*)    All other components within normal limits  CBC  I-STAT CHEM 8, ED  TROPONIN I (HIGH SENSITIVITY)  TROPONIN I (HIGH SENSITIVITY)    EKG EKG Interpretation  Date/Time:  Tuesday November 25 2021 13:07:36 EST Ventricular Rate:  67 PR Interval:  114 QRS Duration: 102 QT Interval:  412 QTC Calculation: 435 R Axis:   89 Text Interpretation: Normal sinus rhythm Normal ECG Confirmed by Pattricia Boss [phone]) on 11/25/2021 1:38:24 PM  Radiology No results found.  Procedures Procedures   Medications Ordered in ED Medications  lactated ringers bolus 1,000 mL (0 mLs Intravenous Stopped 11/25/21 1452)    ED Course  I have reviewed the triage vital signs and the nursing notes.  Pertinent labs & imaging results that were available during my care of the patient were reviewed by me and considered in my medical decision making (see chart for details).    MDM Rules/Calculators/A&P                         65 year old male presents today after near syncopal episode.  Work-up here is normal including normal EKG, CBC, chemistry, and troponin.  Patient felt nauseated before event and this is most consistent with vasovagal.  However, cardiac etiology cannot be completely ruled out.  Feel that risk is low given normal EKG and normal troponin.  Patient's symptoms have completely resolved with resolution of his nausea.  Nausea resolved after vomiting.  Here in the ED he has been completely stable.  He  is discharged in improved condition.  He is advised to follow-up with his primary care physician and is given return precautions and voices understanding. Final Clinical Impression(s) / ED Diagnoses Final diagnoses:  Vasovagal near syncope  Nausea    Rx / DC Orders ED Discharge Orders     None        Pattricia Boss, MD 11/25/21 1530

## 2021-11-25 NOTE — ED Notes (Signed)
Patient given discharge instructions, all questions answered. Patient in possession of all belongings, directed to the discharge area  

## 2021-12-31 DIAGNOSIS — Z13220 Encounter for screening for lipoid disorders: Secondary | ICD-10-CM | POA: Diagnosis not present

## 2021-12-31 DIAGNOSIS — Z0000 Encounter for general adult medical examination without abnormal findings: Secondary | ICD-10-CM | POA: Diagnosis not present

## 2021-12-31 DIAGNOSIS — Z125 Encounter for screening for malignant neoplasm of prostate: Secondary | ICD-10-CM | POA: Diagnosis not present

## 2022-01-08 DIAGNOSIS — H01004 Unspecified blepharitis left upper eyelid: Secondary | ICD-10-CM | POA: Diagnosis not present

## 2022-01-09 ENCOUNTER — Other Ambulatory Visit (HOSPITAL_COMMUNITY): Payer: Self-pay

## 2022-01-09 MED ORDER — TOBRAMYCIN-DEXAMETHASONE 0.3-0.1 % OP OINT
TOPICAL_OINTMENT | OPHTHALMIC | 3 refills | Status: DC
  Filled 2022-01-09: qty 3.5, 30d supply, fill #0
  Filled 2022-01-28: qty 3.5, 7d supply, fill #0

## 2022-01-09 MED ORDER — DOXYCYCLINE HYCLATE 100 MG PO TABS
100.0000 mg | ORAL_TABLET | ORAL | 3 refills | Status: AC
  Filled 2022-01-09: qty 90, 90d supply, fill #0

## 2022-01-12 ENCOUNTER — Other Ambulatory Visit (HOSPITAL_COMMUNITY): Payer: Self-pay

## 2022-01-14 DIAGNOSIS — Z23 Encounter for immunization: Secondary | ICD-10-CM | POA: Diagnosis not present

## 2022-01-21 ENCOUNTER — Other Ambulatory Visit (HOSPITAL_COMMUNITY): Payer: Self-pay

## 2022-01-28 ENCOUNTER — Other Ambulatory Visit (HOSPITAL_COMMUNITY): Payer: Self-pay

## 2022-01-28 MED FILL — Fluticasone Propionate Nasal Susp 50 MCG/ACT: NASAL | 30 days supply | Qty: 16 | Fill #3 | Status: AC

## 2022-04-29 DIAGNOSIS — H01004 Unspecified blepharitis left upper eyelid: Secondary | ICD-10-CM | POA: Diagnosis not present

## 2022-04-29 DIAGNOSIS — H52203 Unspecified astigmatism, bilateral: Secondary | ICD-10-CM | POA: Diagnosis not present

## 2022-04-29 DIAGNOSIS — H01001 Unspecified blepharitis right upper eyelid: Secondary | ICD-10-CM | POA: Diagnosis not present

## 2022-04-30 ENCOUNTER — Other Ambulatory Visit (HOSPITAL_COMMUNITY): Payer: Self-pay

## 2022-04-30 MED ORDER — TOBRAMYCIN-DEXAMETHASONE 0.3-0.1 % OP SUSP
OPHTHALMIC | 1 refills | Status: AC
  Filled 2022-04-30: qty 5, 30d supply, fill #0

## 2022-05-05 ENCOUNTER — Other Ambulatory Visit (HOSPITAL_COMMUNITY): Payer: Self-pay

## 2022-05-06 ENCOUNTER — Other Ambulatory Visit (HOSPITAL_COMMUNITY): Payer: Self-pay

## 2022-05-08 ENCOUNTER — Other Ambulatory Visit (HOSPITAL_COMMUNITY): Payer: Self-pay

## 2022-05-08 MED ORDER — FLUTICASONE PROPIONATE 50 MCG/ACT NA SUSP
NASAL | 11 refills | Status: AC
  Filled 2022-05-08: qty 16, 30d supply, fill #0
  Filled 2022-10-19: qty 16, 30d supply, fill #1
  Filled 2023-03-12: qty 16, 30d supply, fill #2

## 2022-06-05 ENCOUNTER — Other Ambulatory Visit (HOSPITAL_COMMUNITY): Payer: Self-pay

## 2022-10-07 ENCOUNTER — Other Ambulatory Visit: Payer: Self-pay

## 2022-10-07 MED ORDER — COVID-19 MRNA VAC-TRIS(PFIZER) 30 MCG/0.3ML IM SUSP
INTRAMUSCULAR | 0 refills | Status: AC
  Filled 2022-10-07: qty 0.3, 1d supply, fill #0

## 2022-10-08 ENCOUNTER — Other Ambulatory Visit: Payer: Self-pay

## 2022-10-14 ENCOUNTER — Other Ambulatory Visit: Payer: Self-pay

## 2022-10-19 ENCOUNTER — Other Ambulatory Visit (HOSPITAL_COMMUNITY): Payer: Self-pay

## 2022-11-18 DIAGNOSIS — H903 Sensorineural hearing loss, bilateral: Secondary | ICD-10-CM | POA: Diagnosis not present

## 2023-02-04 DIAGNOSIS — E785 Hyperlipidemia, unspecified: Secondary | ICD-10-CM | POA: Diagnosis not present

## 2023-02-04 DIAGNOSIS — K824 Cholesterolosis of gallbladder: Secondary | ICD-10-CM | POA: Diagnosis not present

## 2023-02-04 DIAGNOSIS — Z0000 Encounter for general adult medical examination without abnormal findings: Secondary | ICD-10-CM | POA: Diagnosis not present

## 2023-02-04 DIAGNOSIS — J302 Other seasonal allergic rhinitis: Secondary | ICD-10-CM | POA: Diagnosis not present

## 2023-02-17 ENCOUNTER — Other Ambulatory Visit (HOSPITAL_COMMUNITY): Payer: Self-pay

## 2023-02-17 MED ORDER — MAXITROL 3.5-10000-0.1 OP OINT
TOPICAL_OINTMENT | OPHTHALMIC | 1 refills | Status: AC
  Filled 2023-02-17: qty 3.5, 10d supply, fill #0

## 2023-02-18 ENCOUNTER — Other Ambulatory Visit (HOSPITAL_COMMUNITY): Payer: Self-pay

## 2023-02-26 DIAGNOSIS — K824 Cholesterolosis of gallbladder: Secondary | ICD-10-CM | POA: Diagnosis not present

## 2023-05-10 ENCOUNTER — Ambulatory Visit (INDEPENDENT_AMBULATORY_CARE_PROVIDER_SITE_OTHER): Payer: Commercial Managed Care - PPO

## 2023-05-10 ENCOUNTER — Ambulatory Visit (INDEPENDENT_AMBULATORY_CARE_PROVIDER_SITE_OTHER): Payer: Self-pay | Admitting: Podiatry

## 2023-05-10 DIAGNOSIS — M21612 Bunion of left foot: Secondary | ICD-10-CM

## 2023-05-10 DIAGNOSIS — M21619 Bunion of unspecified foot: Secondary | ICD-10-CM

## 2023-05-10 DIAGNOSIS — M2012 Hallux valgus (acquired), left foot: Secondary | ICD-10-CM

## 2023-05-10 DIAGNOSIS — D2372 Other benign neoplasm of skin of left lower limb, including hip: Secondary | ICD-10-CM

## 2023-05-10 NOTE — Progress Notes (Signed)
Chief Complaint  Patient presents with   Bunions    Patient came in today for left foot bunion and ball of the foot pain, patient states that is feels like walking on something, numbness, started a year ago, X-Rays done today     HPI: 67 y.o. male presenting today as a new patient for evaluation of 2 separate issues.  First the patient has been experiencing some numbness with paresthesia over the past year specifically to the left forefoot around the second and third toes.  No change in activity or shoe gear.  He does wear Western style boots in the OR which she states exacerbate the pain to the area, but he has done this for the past 20 years.  Idiopathic gradual onset.  Patient also presents to have his left foot bunion evaluated.  He does have some concern that possibly the bunion and the paresthesia to the toes are related.  He has had the bunion for several years and currently it is minimally symptomatic.  No past medical history on file.  No past surgical history on file.  Allergies  Allergen Reactions   Doxycycline     Other Reaction(s): headache and double vision     Physical Exam: General: The patient is alert and oriented x3 in no acute distress.  Dermatology: There is some hyperkeratotic callus tissue to the plantar aspect of the second MTP with multiple central nucleated cores.  This is the most focal area of symptoms associated to the numbness and burning sensation to the forefoot.  Vascular: Palpable pedal pulses bilaterally. Capillary refill within normal limits.  No appreciable edema.  No erythema.  Neurological: Grossly intact via light touch  Musculoskeletal Exam: Hallux valgus deformity noted to the left foot with prominence of the medial aspect of the head of the first metatarsal and lateral deviation of the great toe.  Radiographic Exam LT foot 05/10/2023:  Normal osseous mineralization.  There is some mild to moderate degenerative changes noted to the first  MTP left.  Bipartite sesamoids.  No fractures or osseous irregularities noted.  Increased intermetatarsal angle with increased hallux abductus angle also noted on AP view.  Impression: Hallux abductovalgus  Assessment/Plan of Care: 1.  Symptomatic callus/porokeratosis plantar second MTP LT complicated by numbness with burning type sensations -Excisional debridement of the hyperkeratotic callus was performed today using a 312 scalpel.  No bleeding or incident.  He did feel significant relief of symptoms with debridement.  Salicylic acid and a Band-Aid was applied. -Salicylic acid provided to apply daily for an additional 1-2 weeks to allow more skin to slough off from this area -Advised against going barefoot.  He states that he already does not go barefoot  2.  Hallux valgus left -Patient evaluated.  X-rays reviewed -Currently the bunion is minimally symptomatic.  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  We did discuss surgery which would include Lapidus type bunionectomy.  Although he would be able to weight-bear early in a cam boot there is concern for edema and swelling postoperatively especially when standing in the OR.  Recommend 4 weeks off of work postoperatively.  Ultimately about 8 weeks in the cam boot -For now the bunion is minimally symptomatic.  Continue conservative care for now -Return to clinic as needed  *Cardiothoracic surgeon at  [facility]        Felecia Shelling, DPM [facility]  Dr. Felecia Shelling, DPM    [address] [place], [place] [address]                Office [phone]  Fax [phone]

## 2023-05-13 DIAGNOSIS — H5203 Hypermetropia, bilateral: Secondary | ICD-10-CM | POA: Diagnosis not present

## 2023-05-13 DIAGNOSIS — H524 Presbyopia: Secondary | ICD-10-CM | POA: Diagnosis not present

## 2023-09-13 DIAGNOSIS — D225 Melanocytic nevi of trunk: Secondary | ICD-10-CM | POA: Diagnosis not present

## 2023-09-13 DIAGNOSIS — L814 Other melanin hyperpigmentation: Secondary | ICD-10-CM | POA: Diagnosis not present

## 2023-09-13 DIAGNOSIS — L218 Other seborrheic dermatitis: Secondary | ICD-10-CM | POA: Diagnosis not present

## 2023-09-13 DIAGNOSIS — L905 Scar conditions and fibrosis of skin: Secondary | ICD-10-CM | POA: Diagnosis not present

## 2023-09-13 DIAGNOSIS — Z85828 Personal history of other malignant neoplasm of skin: Secondary | ICD-10-CM | POA: Diagnosis not present

## 2023-09-13 DIAGNOSIS — L821 Other seborrheic keratosis: Secondary | ICD-10-CM | POA: Diagnosis not present

## 2023-09-24 ENCOUNTER — Other Ambulatory Visit: Payer: Self-pay

## 2023-09-24 MED ORDER — INFLUENZA VAC A&B SURF ANT ADJ 0.5 ML IM SUSY
0.5000 mL | PREFILLED_SYRINGE | Freq: Once | INTRAMUSCULAR | 0 refills | Status: AC
  Filled 2023-09-24: qty 0.5, 1d supply, fill #0

## 2023-09-24 MED ORDER — PNEUMOCOCCAL 20-VAL CONJ VACC 0.5 ML IM SUSY
0.5000 mL | PREFILLED_SYRINGE | Freq: Once | INTRAMUSCULAR | 0 refills | Status: AC
  Filled 2023-09-24: qty 0.5, 1d supply, fill #0

## 2023-09-24 MED ORDER — COVID-19 MRNA VAC-TRIS(PFIZER) 30 MCG/0.3ML IM SUSY
0.3000 mL | PREFILLED_SYRINGE | Freq: Once | INTRAMUSCULAR | 0 refills | Status: AC
  Filled 2023-09-24: qty 0.3, 1d supply, fill #0

## 2023-09-28 ENCOUNTER — Other Ambulatory Visit: Payer: Self-pay

## 2024-02-09 DIAGNOSIS — J302 Other seasonal allergic rhinitis: Secondary | ICD-10-CM | POA: Diagnosis not present

## 2024-02-09 DIAGNOSIS — E785 Hyperlipidemia, unspecified: Secondary | ICD-10-CM | POA: Diagnosis not present

## 2024-02-09 DIAGNOSIS — K824 Cholesterolosis of gallbladder: Secondary | ICD-10-CM | POA: Diagnosis not present

## 2024-02-09 DIAGNOSIS — Z125 Encounter for screening for malignant neoplasm of prostate: Secondary | ICD-10-CM | POA: Diagnosis not present

## 2024-02-09 DIAGNOSIS — Z0000 Encounter for general adult medical examination without abnormal findings: Secondary | ICD-10-CM | POA: Diagnosis not present

## 2024-02-09 DIAGNOSIS — Z1211 Encounter for screening for malignant neoplasm of colon: Secondary | ICD-10-CM | POA: Diagnosis not present

## 2024-02-12 ENCOUNTER — Other Ambulatory Visit (HOSPITAL_COMMUNITY): Payer: Self-pay

## 2024-02-12 ENCOUNTER — Telehealth: Payer: Commercial Managed Care - PPO | Admitting: Physician Assistant

## 2024-02-12 DIAGNOSIS — B9789 Other viral agents as the cause of diseases classified elsewhere: Secondary | ICD-10-CM

## 2024-02-12 DIAGNOSIS — J019 Acute sinusitis, unspecified: Secondary | ICD-10-CM | POA: Diagnosis not present

## 2024-02-12 MED ORDER — CETIRIZINE-PSEUDOEPHEDRINE ER 5-120 MG PO TB12
1.0000 | ORAL_TABLET | Freq: Two times a day (BID) | ORAL | 0 refills | Status: AC
  Filled 2024-02-12: qty 24, 12d supply, fill #0

## 2024-02-12 NOTE — Progress Notes (Signed)
 E-Visit for Sinus Problems  We are sorry that you are not feeling well.  Here is how we plan to help!  Based on what you have shared with me it looks like you have sinusitis.  Sinusitis is inflammation and infection in the sinus cavities of the head.  Based on your presentation I believe you most likely have Acute Viral Sinusitis.This is an infection most likely caused by a virus. There is not specific treatment for viral sinusitis other than to help you with the symptoms until the infection runs its course.  You may use an oral decongestant such as Mucinex D or if you have glaucoma or high blood pressure use plain Mucinex. Saline nasal spray help and can safely be used as often as needed for congestion, I have prescribed: Fluticasone nasal spray two sprays in each nostril once a day I also recommend Zyrtec D as it will help relieve your congestion.  Some authorities believe that zinc sprays or the use of Echinacea may shorten the course of your symptoms.  Sinus infections are not as easily transmitted as other respiratory infection, however we still recommend that you avoid close contact with loved ones, especially the very young and elderly.  Remember to wash your hands thoroughly throughout the day as this is the number one way to prevent the spread of infection!  Home Care: Only take medications as instructed by your medical team. Do not take these medications with alcohol. A steam or ultrasonic humidifier can help congestion.  You can place a towel over your head and breathe in the steam from hot water coming from a faucet. Avoid close contacts especially the very young and the elderly. Cover your mouth when you cough or sneeze. Always remember to wash your hands.  Get Help Right Away If: You develop worsening fever or sinus pain. You develop a severe head ache or visual changes. Your symptoms persist after you have completed your treatment plan.  Make sure you Understand these  instructions. Will watch your condition. Will get help right away if you are not doing well or get worse.   Thank you for choosing an e-visit.  Your e-visit answers were reviewed by a board certified advanced clinical practitioner to complete your personal care plan. Depending upon the condition, your plan could have included both over the counter or prescription medications.  Please review your pharmacy choice. Make sure the pharmacy is open so you can pick up prescription now. If there is a problem, you may contact your provider through [employer] and have the prescription routed to another pharmacy.  Your safety is important to [place]. If you have drug allergies check your prescription carefully.   For the next 24 hours you can use MyChart to ask questions about today's visit, request a non-urgent call back, or ask for a work or school excuse. You will get an email in the next two days asking about your experience. I hope that your e-visit has been valuable and will speed your recovery.

## 2024-02-12 NOTE — Progress Notes (Signed)
 I have spent 5 minutes in review of e-visit questionnaire, review and updating patient chart, medical decision making and response to patient.   Laure Kidney, PA-C

## 2024-03-17 DIAGNOSIS — K824 Cholesterolosis of gallbladder: Secondary | ICD-10-CM | POA: Diagnosis not present

## 2024-05-15 ENCOUNTER — Other Ambulatory Visit (HOSPITAL_COMMUNITY): Payer: Self-pay

## 2024-05-15 MED ORDER — BISACODYL 5 MG PO TBEC
DELAYED_RELEASE_TABLET | ORAL | 0 refills | Status: AC
  Filled 2024-05-15: qty 4, 1d supply, fill #0

## 2024-05-15 MED ORDER — PEG 3350-KCL-NA BICARB-NACL 420 G PO SOLR
ORAL | 0 refills | Status: AC
  Filled 2024-05-15: qty 4000, 1d supply, fill #0

## 2024-05-26 DIAGNOSIS — K635 Polyp of colon: Secondary | ICD-10-CM | POA: Diagnosis not present

## 2024-05-26 DIAGNOSIS — Z09 Encounter for follow-up examination after completed treatment for conditions other than malignant neoplasm: Secondary | ICD-10-CM | POA: Diagnosis not present

## 2024-05-26 DIAGNOSIS — Z83719 Family history of colon polyps, unspecified: Secondary | ICD-10-CM | POA: Diagnosis not present

## 2024-05-26 DIAGNOSIS — D122 Benign neoplasm of ascending colon: Secondary | ICD-10-CM | POA: Diagnosis not present

## 2024-05-26 DIAGNOSIS — Z860100 Personal history of colon polyps, unspecified: Secondary | ICD-10-CM | POA: Diagnosis not present

## 2024-05-26 DIAGNOSIS — K5730 Diverticulosis of large intestine without perforation or abscess without bleeding: Secondary | ICD-10-CM | POA: Diagnosis not present

## 2024-09-15 ENCOUNTER — Other Ambulatory Visit (HOSPITAL_COMMUNITY): Payer: Self-pay

## 2024-09-15 ENCOUNTER — Other Ambulatory Visit: Payer: Self-pay

## 2024-09-15 MED ORDER — COVID-19 MRNA VAC-TRIS(PFIZER) 30 MCG/0.3ML IM SUSY
0.3000 mL | PREFILLED_SYRINGE | Freq: Once | INTRAMUSCULAR | 0 refills | Status: AC
  Filled 2024-09-15: qty 0.3, 1d supply, fill #0

## 2024-09-15 MED ORDER — RSVPREF3 VAC RECOMB ADJUVANTED 120 MCG/0.5ML IM SUSR
0.5000 mL | Freq: Once | INTRAMUSCULAR | 0 refills | Status: AC
  Filled 2024-09-15 – 2024-09-22 (×4): qty 0.5, 1d supply, fill #0

## 2024-09-16 ENCOUNTER — Other Ambulatory Visit (HOSPITAL_COMMUNITY): Payer: Self-pay

## 2024-09-22 ENCOUNTER — Other Ambulatory Visit (HOSPITAL_COMMUNITY): Payer: Self-pay

## 2024-10-10 ENCOUNTER — Other Ambulatory Visit: Payer: Self-pay

## 2024-10-10 ENCOUNTER — Emergency Department (HOSPITAL_COMMUNITY): Admission: EM | Admit: 2024-10-10 | Discharge: 2024-10-10 | Disposition: A | Discharge status: Home / Self Care

## 2024-10-10 DIAGNOSIS — R55 Syncope and collapse: Secondary | ICD-10-CM | POA: Insufficient documentation

## 2024-10-10 LAB — CBC
HCT: 42 % (ref 39.0–52.0)
Hemoglobin: 14.1 g/dL (ref 13.0–17.0)
MCH: 32 pg (ref 26.0–34.0)
MCHC: 33.6 g/dL (ref 30.0–36.0)
MCV: 95.5 fL (ref 80.0–100.0)
Platelets: 162 K/uL (ref 150–400)
RBC: 4.4 MIL/uL (ref 4.22–5.81)
RDW: 12.5 % (ref 11.5–15.5)
WBC: 4.5 K/uL (ref 4.0–10.5)
nRBC: 0 % (ref 0.0–0.2)

## 2024-10-10 LAB — TROPONIN I (HIGH SENSITIVITY): Troponin I (High Sensitivity): 4 ng/L (ref <18)

## 2024-10-10 LAB — COMPREHENSIVE METABOLIC PANEL WITH GFR
ALT: 21 U/L (ref 0–44)
AST: 25 U/L (ref 15–41)
Albumin: 4.1 g/dL (ref 3.5–5.0)
Alkaline Phosphatase: 38 U/L (ref 38–126)
Anion gap: 10 (ref 5–15)
BUN: 17 mg/dL (ref 8–23)
CO2: 24 mmol/L (ref 22–32)
Calcium: 9.3 mg/dL (ref 8.9–10.3)
Chloride: 103 mmol/L (ref 98–111)
Creatinine, Ser: 1.19 mg/dL (ref 0.61–1.24)
GFR, Estimated: >60 mL/min (ref >60)
Glucose, Bld: 103 mg/dL — ABNORMAL HIGH (ref 70–99)
Potassium: 4.1 mmol/L (ref 3.5–5.1)
Sodium: 137 mmol/L (ref 135–145)
Total Bilirubin: 0.9 mg/dL (ref 0.0–1.2)
Total Protein: 6.9 g/dL (ref 6.5–8.1)

## 2024-10-10 LAB — I-STAT CHEM 8, ED
BUN: 19 mg/dL (ref 8–23)
Calcium, Ion: 1.2 mmol/L (ref 1.15–1.40)
Chloride: 104 mmol/L (ref 98–111)
Creatinine, Ser: 1.2 mg/dL (ref 0.61–1.24)
Glucose, Bld: 100 mg/dL — ABNORMAL HIGH (ref 70–99)
HCT: 41 % (ref 39.0–52.0)
Hemoglobin: 13.9 g/dL (ref 13.0–17.0)
Potassium: 4.2 mmol/L (ref 3.5–5.1)
Sodium: 139 mmol/L (ref 135–145)
TCO2: 24 mmol/L (ref 22–32)

## 2024-10-10 LAB — CBG MONITORING, ED: Glucose-Capillary: 94 mg/dL (ref 70–99)

## 2024-10-10 NOTE — ED Provider Notes (Signed)
 [place] EMERGENCY DEPARTMENT AT [facility] Provider Note   CSN: [id number] Arrival date & time: 10/10/24  1007     Patient presents with: Near Syncope   Dan Chavez is a 68 y.o. male.   68 year old male presents for evaluation of near syncope.  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  States he got very lightheaded.  States this has never happened before he is otherwise feeling fine.  States he is otherwise healthy and has not been ill recently.  States he did not receive his flu shot yesterday.  Denies any other symptoms or concerns at this time.   Near Syncope Pertinent negatives include no chest pain, no abdominal pain and no shortness of breath.       Prior to Admission medications   Medication Sig Start Date End Date Taking? Authorizing Provider  azithromycin  (ZITHROMAX ) 250 MG tablet Take 2 tablets by mouth on day 1, then take 1 tablet daily on days 2-5. 10/01/21     bisacodyl  (DULCOLAX) 5 MG EC tablet Take as directed per md instructions. 03/20/24     COVID-19 mRNA Vac-TriS, Pfizer, SUSP injection Inject 0.3 ml into the muscle. 10/07/22   Luiz Channel, MD  doxycycline  (VIBRA -TABS) 100 MG tablet Take 1 tablet (100 mg total) by mouth daily. 01/07/22     fluticasone  (FLONASE ) 50 MCG/ACT nasal spray instill 1 to 2 sprays into each nostril once daily 11/07/14   Onofre, Urias K, MD  fluticasone  (FLONASE ) 50 MCG/ACT nasal spray INSTILL 2 SPRAYS INTO EACH NOSTRIL ONCE DAILY 02/04/21 02/04/22  Signa Rush, MD  fluticasone  (FLONASE ) 50 MCG/ACT nasal spray INSTILL 2 SPRAYS INTO EACH NOSTRIL ONCE DAILY 05/08/22     neomycin -polymyxin-dexameth (MAXITROL ) 0.1 % OINT Apply to lower left eyelid at bedtime for 7-10 days then stop 02/17/23     polyethylene glycol-electrolytes (NULYTELY) 420 g solution Take as directed per md instructions. 03/20/24     sildenafil (REVATIO) 20 MG tablet TAKE 1 TABLET EVERY MORNING FOR RAYNAUDS SYMPTOMS  05/03/17   Obadiah Coy, MD  tobramycin -dexamethasone  (TOBRADEX ) ophthalmic solution Place 1 drop in both eyes twice a day. 04/30/22       Allergies: Doxycycline     Review of Systems  Constitutional:  Negative for chills and fever.  HENT:  Negative for ear pain and sore throat.   Eyes:  Negative for pain and visual disturbance.  Respiratory:  Negative for cough and shortness of breath.   Cardiovascular:  Positive for near-syncope. Negative for chest pain and palpitations.  Gastrointestinal:  Negative for abdominal pain and vomiting.  Genitourinary:  Negative for dysuria and hematuria.  Musculoskeletal:  Negative for arthralgias and back pain.  Skin:  Negative for color change and rash.  Neurological:  Positive for syncope and light-headedness. Negative for seizures.  All other systems reviewed and are negative.   Updated Vital Signs BP 109/68   Pulse (!) 57   Temp 98.9 F (37.2 C)   Resp 12   Ht 6' (1.829 m)   Wt 70.3 kg   SpO2 100%   BMI 21.02 kg/m   Physical Exam Vitals and nursing note reviewed.  Constitutional:      General: He is not in acute distress.    Appearance: Normal appearance. He is well-developed. He is not ill-appearing.  HENT:     Head: Normocephalic and atraumatic.  Eyes:     Conjunctiva/sclera: Conjunctivae normal.  Cardiovascular:  Rate and Rhythm: Normal rate and regular rhythm.     Pulses: Normal pulses.     Heart sounds: Normal heart sounds. No murmur heard. Pulmonary:     Effort: Pulmonary effort is normal. No respiratory distress.     Breath sounds: Normal breath sounds.  Abdominal:     Palpations: Abdomen is soft.     Tenderness: There is no abdominal tenderness.  Musculoskeletal:        General: No swelling.     Cervical back: Neck supple.  Skin:    General: Skin is warm and dry.     Capillary Refill: Capillary refill takes less than 2 seconds.  Neurological:     Mental Status: He is alert.  Psychiatric:        Mood and Affect:  Mood normal.     (all labs ordered are listed, but only abnormal results are displayed) Labs Reviewed  COMPREHENSIVE METABOLIC PANEL WITH GFR - Abnormal; Notable for the following components:      Result Value   Glucose, Bld 103 (*)    All other components within normal limits  I-STAT CHEM 8, ED - Abnormal; Notable for the following components:   Glucose, Bld 100 (*)    All other components within normal limits  CBC  CBG MONITORING, ED  TROPONIN I (HIGH SENSITIVITY)  TROPONIN I (HIGH SENSITIVITY)    EKG: EKG Interpretation Date/Time:  Tuesday October 10 2024 10:18:34 EDT Ventricular Rate:  63 PR Interval:  147 QRS Duration:  112 QT Interval:  441 QTC Calculation: 452 R Axis:   80  Text Interpretation: Sinus rhythm Ventricular premature complex Consider left atrial enlargement Incomplete right bundle branch block Compared with prior EKG from 11/25/2021 Confirmed by Gennaro Bouchard ([id number]) on 10/10/2024 10:21:47 AM  Radiology: No results found.   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  He has had no recurrence of his symptoms while he has been here is feeling well.  Lab workup EKG are unremarkable.  He feels comfortable being discharged.  Advise close follow-up primary care otherwise return to the ER for new or worsening symptoms.  Problems Addressed: Near syncope: acute illness or injury  Amount and/or Complexity of Data Reviewed External Data Reviewed: notes.    Details: No prior ER records for review Labs: ordered. Decision-making details documented in ED Course.    Details: Ordered and reviewed by me and unremarkable ECG/medicine tests: ordered and independent interpretation performed. Decision-making details documented in ED Course.     Details: Ordered and interpreted by me in the absence of cardiology and shows sinus rhythm with an occasional PVC, right bundle branch block, no acute change when compared to prior  Risk OTC drugs. Prescription drug management.     Final diagnoses:  Near syncope    ED Discharge Orders     None          Gennaro Bouchard CROME, DO 10/10/24 1444

## 2024-10-10 NOTE — ED Notes (Signed)
 Lab work sent, CBG normal. Pt comfortable resting VSS on monitor

## 2024-10-10 NOTE — ED Triage Notes (Signed)
 Pt arrived amb from working in [place]. Pt report dizzy feeling perhaps a vagal response to a stressful work situation. Pt did not fall or lose consciousness. VSS, no med history reported

## 2024-10-10 NOTE — Discharge Instructions (Addendum)
 Follow-up with your primary care doctor in 1 to 2 weeks or as needed.  Return to the ER for new or worsening symptoms.

## 2024-10-11 ENCOUNTER — Other Ambulatory Visit (HOSPITAL_BASED_OUTPATIENT_CLINIC_OR_DEPARTMENT_OTHER): Payer: Self-pay | Admitting: Internal Medicine

## 2024-10-11 DIAGNOSIS — E785 Hyperlipidemia, unspecified: Secondary | ICD-10-CM | POA: Diagnosis not present

## 2024-10-11 DIAGNOSIS — R55 Syncope and collapse: Secondary | ICD-10-CM | POA: Diagnosis not present

## 2024-11-02 ENCOUNTER — Ambulatory Visit (HOSPITAL_BASED_OUTPATIENT_CLINIC_OR_DEPARTMENT_OTHER)
Admission: RE | Admit: 2024-11-02 | Discharge: 2024-11-02 | Disposition: A | Discharge status: Home / Self Care | Payer: Self-pay | Source: Ambulatory Visit | Attending: Internal Medicine | Admitting: Internal Medicine

## 2024-11-02 DIAGNOSIS — E785 Hyperlipidemia, unspecified: Secondary | ICD-10-CM

## 2024-11-16 ENCOUNTER — Other Ambulatory Visit (HOSPITAL_COMMUNITY): Payer: Self-pay

## 2024-11-16 MED ORDER — ROSUVASTATIN CALCIUM 10 MG PO TABS
10.0000 mg | ORAL_TABLET | Freq: Every day | ORAL | 3 refills | Status: AC
  Filled 2024-11-16: qty 90, 90d supply, fill #0

## 2024-12-25 ENCOUNTER — Other Ambulatory Visit (HOSPITAL_COMMUNITY): Payer: Self-pay

## 2024-12-25 MED ORDER — ATORVASTATIN CALCIUM 10 MG PO TABS
10.0000 mg | ORAL_TABLET | Freq: Every day | ORAL | 0 refills | Status: DC
  Filled 2024-12-25: qty 30, 30d supply, fill #0

## 2025-01-20 ENCOUNTER — Other Ambulatory Visit (HOSPITAL_COMMUNITY): Payer: Self-pay

## 2025-01-23 ENCOUNTER — Other Ambulatory Visit (HOSPITAL_COMMUNITY): Payer: Self-pay

## 2025-01-23 MED ORDER — ATORVASTATIN CALCIUM 10 MG PO TABS
10.0000 mg | ORAL_TABLET | Freq: Every day | ORAL | 0 refills | Status: AC
  Filled 2025-01-23: qty 30, 30d supply, fill #0

## 2025-01-24 ENCOUNTER — Other Ambulatory Visit (HOSPITAL_COMMUNITY): Payer: Self-pay
